# Patient Record
Sex: Female | Born: 1985 | Race: Asian | Hispanic: No | Marital: Married | Smoking: Never smoker
Health system: Southern US, Community
[De-identification: ages and names within clinical notes are randomized; demographics above are authoritative.]

## PROBLEM LIST (undated history)

## (undated) DIAGNOSIS — Z789 Other specified health status: Secondary | ICD-10-CM

## (undated) HISTORY — PX: NO PAST SURGERIES: SHX2092

---

## 2010-10-23 NOTE — L&D Delivery Note (Signed)
Delivery Note At 11:30 AM a viable and healthy female was delivered via Vaginal, Spontaneous Delivery (Presentation: Right Occiput Anterior).  APGAR: 9, 9; weight 5 lb 15.6 oz (2710 g).  Terminal meconium. Placenta status: Intact, Spontaneous.  Cord: 3 vessels with the following complications: None.  Fundus firm after uterine massage. No complications with shoulders.  Anesthesia: None  Episiotomy: None Lacerations: 1st degree vaginal Suture Repair: 3.0 vicryl Est. Blood Loss (mL): 200  Mom to postpartum.  Baby to room in with mother.   Infant delivered by Payton Doughty, PA-S Delivery supervised by Dr. Cyndia Bent, [facility] 06/06/2011, 12:10 PM

## 2011-04-24 ENCOUNTER — Other Ambulatory Visit: Payer: Self-pay | Admitting: Family Medicine

## 2011-04-24 DIAGNOSIS — Z3689 Encounter for other specified antenatal screening: Secondary | ICD-10-CM

## 2011-04-24 LAB — ABO/RH: RH Type: POSITIVE

## 2011-04-24 LAB — RPR: RPR: NONREACTIVE

## 2011-04-24 LAB — RUBELLA ANTIBODY, IGM: Rubella: IMMUNE

## 2011-04-24 LAB — ANTIBODY SCREEN: Antibody Screen: NEGATIVE

## 2011-04-24 LAB — HEPATITIS B SURFACE ANTIGEN: Hepatitis B Surface Ag: NEGATIVE

## 2011-04-25 ENCOUNTER — Ambulatory Visit (HOSPITAL_COMMUNITY)
Admission: RE | Admit: 2011-04-25 | Discharge: 2011-04-25 | Disposition: A | Discharge status: Home / Self Care | Payer: Medicaid Other | Source: Ambulatory Visit | Attending: Family Medicine | Admitting: Family Medicine

## 2011-04-25 DIAGNOSIS — Z1389 Encounter for screening for other disorder: Secondary | ICD-10-CM | POA: Insufficient documentation

## 2011-04-25 DIAGNOSIS — Z363 Encounter for antenatal screening for malformations: Secondary | ICD-10-CM | POA: Insufficient documentation

## 2011-04-25 DIAGNOSIS — Z3689 Encounter for other specified antenatal screening: Secondary | ICD-10-CM

## 2011-04-25 DIAGNOSIS — O3660X0 Maternal care for excessive fetal growth, unspecified trimester, not applicable or unspecified: Secondary | ICD-10-CM | POA: Insufficient documentation

## 2011-04-25 DIAGNOSIS — O358XX0 Maternal care for other (suspected) fetal abnormality and damage, not applicable or unspecified: Secondary | ICD-10-CM | POA: Insufficient documentation

## 2011-06-06 ENCOUNTER — Encounter (HOSPITAL_COMMUNITY): Payer: Self-pay

## 2011-06-06 ENCOUNTER — Inpatient Hospital Stay (HOSPITAL_COMMUNITY)
Admission: AD | Admit: 2011-06-06 | Discharge: 2011-06-08 | DRG: 775 | Disposition: A | Discharge status: Home / Self Care | Payer: Medicaid Other | Source: Ambulatory Visit | Attending: Obstetrics & Gynecology | Admitting: Obstetrics & Gynecology

## 2011-06-06 HISTORY — DX: Other specified health status: Z78.9

## 2011-06-06 LAB — CBC
MCH: 26.8 pg (ref 26.0–34.0)
MCHC: 33.6 g/dL (ref 30.0–36.0)
MCV: 79.8 fL (ref 78.0–100.0)
Platelets: 259 10*3/uL (ref 150–400)
RDW: 12.5 % (ref 11.5–15.5)

## 2011-06-06 MED ORDER — IBUPROFEN 600 MG PO TABS: 600.0000 mg | ORAL_TABLET | Freq: Four times a day (QID) | ORAL | Status: DC | PRN

## 2011-06-06 MED ORDER — SIMETHICONE 80 MG PO CHEW: 80.0000 mg | CHEWABLE_TABLET | ORAL | Status: DC | PRN

## 2011-06-06 MED ORDER — DIBUCAINE 1 % RE OINT: 1.0000 "application " | TOPICAL_OINTMENT | RECTAL | Status: DC | PRN

## 2011-06-06 MED ORDER — ONDANSETRON HCL 4 MG/2ML IJ SOLN: 4.0000 mg | Freq: Four times a day (QID) | INTRAMUSCULAR | Status: DC | PRN

## 2011-06-06 MED ORDER — ONDANSETRON HCL 4 MG PO TABS: 4.0000 mg | ORAL_TABLET | ORAL | Status: DC | PRN

## 2011-06-06 MED ORDER — LACTATED RINGERS IV SOLN: INTRAVENOUS | Status: DC

## 2011-06-06 MED ORDER — OXYCODONE-ACETAMINOPHEN 5-325 MG PO TABS: 2.0000 | ORAL_TABLET | ORAL | Status: DC | PRN

## 2011-06-06 MED ORDER — LIDOCAINE HCL (PF) 1 % IJ SOLN: 30.0000 mL | INTRAMUSCULAR | Status: DC | PRN

## 2011-06-06 MED ORDER — FLEET ENEMA 7-19 GM/118ML RE ENEM: 1.0000 | ENEMA | RECTAL | Status: DC | PRN

## 2011-06-06 MED ORDER — ACETAMINOPHEN 325 MG PO TABS: 650.0000 mg | ORAL_TABLET | ORAL | Status: DC | PRN

## 2011-06-06 MED ORDER — OXYTOCIN BOLUS FROM INFUSION
500.0000 mL | Freq: Once | INTRAVENOUS | Status: DC
  Filled 2011-06-06: qty 500
  Filled 2011-06-06: qty 1000

## 2011-06-06 MED ORDER — BENZOCAINE-MENTHOL 20-0.5 % EX AERO: 1.0000 "application " | INHALATION_SPRAY | CUTANEOUS | Status: DC | PRN

## 2011-06-06 MED ORDER — IBUPROFEN 600 MG PO TABS
600.0000 mg | ORAL_TABLET | Freq: Four times a day (QID) | ORAL | Status: DC | PRN
  Administered 2011-06-06: 600 mg via ORAL
  Filled 2011-06-06: qty 1

## 2011-06-06 MED ORDER — WITCH HAZEL-GLYCERIN EX PADS: 1.0000 "application " | MEDICATED_PAD | CUTANEOUS | Status: DC | PRN

## 2011-06-06 MED ORDER — CITRIC ACID-SODIUM CITRATE 334-500 MG/5ML PO SOLN: 30.0000 mL | ORAL | Status: DC | PRN

## 2011-06-06 MED ORDER — ONDANSETRON HCL 4 MG/2ML IJ SOLN: 4.0000 mg | INTRAMUSCULAR | Status: DC | PRN

## 2011-06-06 MED ORDER — ZOLPIDEM TARTRATE 5 MG PO TABS: 5.0000 mg | ORAL_TABLET | Freq: Every evening | ORAL | Status: DC | PRN

## 2011-06-06 MED ORDER — IBUPROFEN 600 MG PO TABS
600.0000 mg | ORAL_TABLET | Freq: Four times a day (QID) | ORAL | Status: DC
  Administered 2011-06-06 – 2011-06-08 (×7): 600 mg via ORAL
  Filled 2011-06-06 (×8): qty 1

## 2011-06-06 MED ORDER — LANOLIN HYDROUS EX OINT: TOPICAL_OINTMENT | CUTANEOUS | Status: DC | PRN

## 2011-06-06 MED ORDER — NALBUPHINE SYRINGE 5 MG/0.5 ML
5.0000 mg | INJECTION | INTRAMUSCULAR | Status: DC | PRN
  Filled 2011-06-06: qty 0.5

## 2011-06-06 MED ORDER — OXYTOCIN 20 UNITS IN LACTATED RINGERS INFUSION - SIMPLE: 125.0000 mL/h | INTRAVENOUS | Status: DC

## 2011-06-06 MED ORDER — LACTATED RINGERS IV SOLN: 500.0000 mL | INTRAVENOUS | Status: DC | PRN

## 2011-06-06 MED ORDER — DIPHENHYDRAMINE HCL 25 MG PO CAPS: 25.0000 mg | ORAL_CAPSULE | Freq: Four times a day (QID) | ORAL | Status: DC | PRN

## 2011-06-06 MED ORDER — TETANUS-DIPHTH-ACELL PERTUSSIS 5-2.5-18.5 LF-MCG/0.5 IM SUSP
0.5000 mL | Freq: Once | INTRAMUSCULAR | Status: AC
  Administered 2011-06-07: 0.5 mL via INTRAMUSCULAR

## 2011-06-06 MED ORDER — LACTATED RINGERS IV SOLN
INTRAVENOUS | Status: DC
  Administered 2011-06-06: 11:00:00 via INTRAVENOUS

## 2011-06-06 MED ORDER — SENNOSIDES-DOCUSATE SODIUM 8.6-50 MG PO TABS
2.0000 | ORAL_TABLET | Freq: Every day | ORAL | Status: DC
  Administered 2011-06-06: 2 via ORAL

## 2011-06-06 MED ORDER — LIDOCAINE HCL (PF) 1 % IJ SOLN
30.0000 mL | INTRAMUSCULAR | Status: DC | PRN
  Filled 2011-06-06 (×2): qty 30

## 2011-06-06 MED ORDER — OXYCODONE-ACETAMINOPHEN 5-325 MG PO TABS: 1.0000 | ORAL_TABLET | ORAL | Status: DC | PRN

## 2011-06-06 MED ORDER — OXYTOCIN BOLUS FROM INFUSION: 500.0000 mL | Freq: Once | INTRAVENOUS | Status: DC

## 2011-06-06 MED ORDER — PRENATAL PLUS 27-1 MG PO TABS
1.0000 | ORAL_TABLET | Freq: Every day | ORAL | Status: DC
  Administered 2011-06-06 – 2011-06-08 (×3): 1 via ORAL
  Filled 2011-06-06 (×3): qty 1

## 2011-06-06 NOTE — Progress Notes (Signed)
2nd preg, no problems with either.  Ctx's started at 0200, no bleeding or leaking

## 2011-06-06 NOTE — H&P (Signed)
Brandi Turner is a 25 y.o. female G2P1001 with IUP at [redacted]w[redacted]d presenting for active labor. Pt states she has been having contractions since 0200 every 2 minutes, associated with none vaginal bleeding, intact, with active.  She desires to no method. She plans on breast and bottle feeding. PNCare at [facility] since 23.5 wks  Prenatal History/Complications: LTC Language barrier  Past Medical History: Past Medical History  Diagnosis Date  . No pertinent past medical history     Past Surgical History: Past Surgical History  Procedure Date  . No past surgeries     Obstetrical History: OB History    Grav Para Term Preterm Abortions TAB SAB Ect Mult Living   2 1 1  0 0 0 0 0 0 1      Gynecological History: No Stis, o rHSV  Social History: History   Social History  . Marital Status: Married    Spouse Name: N/A    Number of Children: N/A  . Years of Education: N/A   Social History Main Topics  . Smoking status: Never Smoker   . Smokeless tobacco: Never Used  . Alcohol Use: No  . Drug Use: No  . Sexually Active:    Other Topics Concern  . None   Social History Narrative  . None    Family History: No family history on file.  Allergies: No Known Allergies  Prescriptions prior to admission  Medication Sig Dispense Refill  . prenatal vitamin w/FE, FA (PRENATAL 1 + 1) 27-1 MG TABS Take 1 tablet by mouth daily.          Review of Systems - Negative except per HPI   Blood pressure 123/60, pulse 79, temperature 97.8 F (36.6 C), temperature source Oral, resp. rate 20, height 5' (1.524 m), weight 53.343 kg (117 lb 9.6 oz). General appearance: alert and cooperative Lungs: clear to auscultation bilaterally Heart: regular rate and rhythm, S1, S2 normal, no murmur, click, rub or gallop Abdomen: soft, non-tender; bowel sounds normal; no masses,  no organomegaly and gravid, size cwd, vertex by leopolds, EFW 6 lbs by leopolds Extremities: extremities normal, atraumatic, no cyanosis or  edema cephalic Baseline: 130-140 bpm, Variability: Good {> 6 bpm), Accelerations: Reactive and Decelerations: Absent Frequency: Every 2-3 minutes Dilation: 6.5 Effacement (%): 90 Station: 0 Exam by:: Lucy Chris RNC   Prenatal labs: ABO, Rh: A/Positive/-- (07/02 0000) Antibody: Negative (07/02 0000) Rubella: Immune  RPR: Nonreactive (07/02 0000)  HBsAg: Negative (07/02 0000)  HIV: Non-reactive (07/02 0000)  GBS: Negative (08/08 0000)  1 hr Glucola 75 Genetic screening  Anatomy US   Assessment: Brandi Turner is a 25 y.o. G2P1001 with an IUP at [redacted]w[redacted]d presenting for active labor  Plan: 1. Admit to labor and delivery for expectant management   Brandi Turner 06/06/2011, 10:43 AM

## 2011-06-07 NOTE — Progress Notes (Signed)
Post Partum Day 1 Subjective: no complaints, up ad lib, voiding, tolerating PO and + flatus. Breastfeeding. No abdominal pain.  Pt was seen using the language line for vietnamese.   Objective: Blood pressure 101/63, pulse 66, temperature 97.8 F (36.6 C), temperature source Oral, resp. rate 18, height 5' (1.524 m), weight 117 lb 9.6 oz (53.343 kg), unknown if currently breastfeeding.  Physical Exam:  General: alert, cooperative and no distress Lochia: appropriate Uterine Fundus: firm, below umbilicus DVT Evaluation: No evidence of DVT seen on physical exam. Negative Homan's sign.   Basename 06/06/11 1052  HGB 13.0  HCT 38.7    Assessment/Plan: Plan for discharge tomorrow Contraception: pt declines birth control but was counseled on abstinence and importance of contraception postpartum Breastfeeding Continue postpartum management   LOS: 1 day   Marena Chancy 06/07/2011, 7:46 AM    Subjective:    Patient ID: Jaquelinne Glendening, female    DOB: [birth year], 25 y.o.   MRN: [id number]  HPI    Review of Systems     Objective:   Physical Exam        Assessment & Plan:

## 2011-06-07 NOTE — Progress Notes (Signed)
UR chart review completed.  

## 2011-06-08 MED ORDER — IBUPROFEN 600 MG PO TABS: 600.0000 mg | ORAL_TABLET | Freq: Four times a day (QID) | ORAL | Status: AC

## 2011-06-08 MED ORDER — DOCUSATE SODIUM 100 MG PO CAPS: 100.0000 mg | ORAL_CAPSULE | Freq: Two times a day (BID) | ORAL | Status: AC | PRN

## 2011-06-08 NOTE — Progress Notes (Signed)
Post Partum Day 2 Subjective: no complaints, up ad lib, voiding and tolerating PO  Objective: Blood pressure 100/63, pulse 70, temperature 98.2 F (36.8 C), temperature source Oral, resp. rate 18, height 5' (1.524 m), weight 117 lb 9.6 oz (53.343 kg), unknown if currently breastfeeding.  Physical Exam:  General: alert and no distress Lochia: appropriate Uterine Fundus: firm DVT Evaluation: No evidence of DVT seen on physical exam. Negative Homan's sign.   Basename 06/06/11 1052  HGB 13.0  HCT 38.7    Assessment/Plan: Discharge home and Breastfeeding.  Declines contraception   LOS: 2 days   Lindaann Slough. 06/08/2011, 5:57 AM

## 2011-06-08 NOTE — Discharge Summary (Signed)
Obstetric Discharge Summary Reason for Admission: onset of labor Prenatal Procedures: ultrasound Intrapartum Procedures: spontaneous vaginal delivery Postpartum Procedures: none Complications-Operative and Postpartum: 1st degree perineal laceration Hemoglobin  Date Value Range Status  06/06/2011 13.0  12.0-15.0 (g/dL) Final     HCT  Date Value Range Status  06/06/2011 38.7  36.0-46.0 (%) Final    Discharge Diagnoses: Term Pregnancy-delivered  Discharge Information: Date: 06/08/2011 Activity: pelvic rest Diet: routine Medications: Ibuprophen and Colace Condition: stable Instructions: refer to practice specific booklet Discharge to: home Follow-up Information    Follow up with [facility] [place]. Call in 6 weeks. (postpartum visit)    Contact information:   [address] [facility] [place] [address]          Newborn Data: Live born female  Birth Weight: 5 lb 15.6 oz (2710 g) APGAR: 9, 9  Home with mother.  Lindaann Slough MD 06/08/2011, 6:00 AM

## 2013-09-04 ENCOUNTER — Other Ambulatory Visit (HOSPITAL_COMMUNITY): Payer: Self-pay | Admitting: Nurse Practitioner

## 2013-09-04 DIAGNOSIS — Z3689 Encounter for other specified antenatal screening: Secondary | ICD-10-CM

## 2013-09-04 LAB — OB RESULTS CONSOLE RPR: RPR: NONREACTIVE

## 2013-09-04 LAB — OB RESULTS CONSOLE ABO/RH: RH Type: POSITIVE

## 2013-09-04 LAB — OB RESULTS CONSOLE HIV ANTIBODY (ROUTINE TESTING): HIV: NONREACTIVE

## 2013-09-04 LAB — OB RESULTS CONSOLE ANTIBODY SCREEN: ANTIBODY SCREEN: NEGATIVE

## 2013-09-04 LAB — OB RESULTS CONSOLE GC/CHLAMYDIA
Chlamydia: NEGATIVE
GC PROBE AMP, GENITAL: NEGATIVE

## 2013-09-04 LAB — OB RESULTS CONSOLE RUBELLA ANTIBODY, IGM: Rubella: IMMUNE

## 2013-09-04 LAB — OB RESULTS CONSOLE HEPATITIS B SURFACE ANTIGEN: Hepatitis B Surface Ag: NEGATIVE

## 2013-09-10 ENCOUNTER — Ambulatory Visit (HOSPITAL_COMMUNITY): Admission: RE | Admit: 2013-09-10 | Payer: Medicaid Other | Source: Ambulatory Visit

## 2013-09-10 ENCOUNTER — Other Ambulatory Visit (HOSPITAL_COMMUNITY): Payer: Self-pay | Admitting: Nurse Practitioner

## 2013-09-10 ENCOUNTER — Ambulatory Visit (HOSPITAL_COMMUNITY)
Admission: RE | Admit: 2013-09-10 | Discharge: 2013-09-10 | Disposition: A | Discharge status: Home / Self Care | Payer: Medicaid Other | Source: Ambulatory Visit | Attending: Nurse Practitioner | Admitting: Nurse Practitioner

## 2013-09-10 DIAGNOSIS — Z3689 Encounter for other specified antenatal screening: Secondary | ICD-10-CM

## 2013-10-22 ENCOUNTER — Other Ambulatory Visit (HOSPITAL_COMMUNITY): Payer: Self-pay | Admitting: Nurse Practitioner

## 2013-10-22 DIAGNOSIS — Z0374 Encounter for suspected problem with fetal growth ruled out: Secondary | ICD-10-CM

## 2013-10-23 NOTE — L&D Delivery Note (Signed)
Delivery Note At 7:23 AM a viable female was delivered via Vaginal, Spontaneous Delivery (Presentation: Left Occiput Anterior).  APGAR: 7, 9; weight 5 lb 13.1 oz (2640 g).   Placenta status: Intact, Spontaneous.  Cord: 3 vessels with the following complications: None.    Anesthesia: None  Episiotomy: None Lacerations: hemostatic 1st degree Suture Repair: na Est. Blood Loss (mL): 300  Mom to postpartum.  Baby to Couplet care / Skin to Skin.  Pt presented and quickly progressed to complete and pushed to deliver a liveborn female with spontaneous cry.  Baby on mom's abdomen. Cord clamped and cut.  Placenta delivered intact with traction and pit. 1st degree laceration hemostatic and not repaired as good cosmetic results noted with natural skin tension.  EBL as above. Mom to postpartum and baby to skin to skin.   Nikitta Sobiech L 12/09/2013, 8:52 AM

## 2013-10-28 ENCOUNTER — Ambulatory Visit (HOSPITAL_COMMUNITY)
Admission: RE | Admit: 2013-10-28 | Discharge: 2013-10-28 | Disposition: A | Discharge status: Home / Self Care | Payer: Medicaid Other | Source: Ambulatory Visit | Attending: Nurse Practitioner | Admitting: Nurse Practitioner

## 2013-10-28 DIAGNOSIS — Z0374 Encounter for suspected problem with fetal growth ruled out: Secondary | ICD-10-CM

## 2013-10-28 DIAGNOSIS — O365990 Maternal care for other known or suspected poor fetal growth, unspecified trimester, not applicable or unspecified: Secondary | ICD-10-CM | POA: Insufficient documentation

## 2013-10-28 IMAGING — US US OB FOLLOW-UP
1 series · 12 of 28 positions shown · non-contrast
Comparison: none

[Series 1: us ob follow up · 37 acquisitions, 12 frames shown]
[im 2/37]
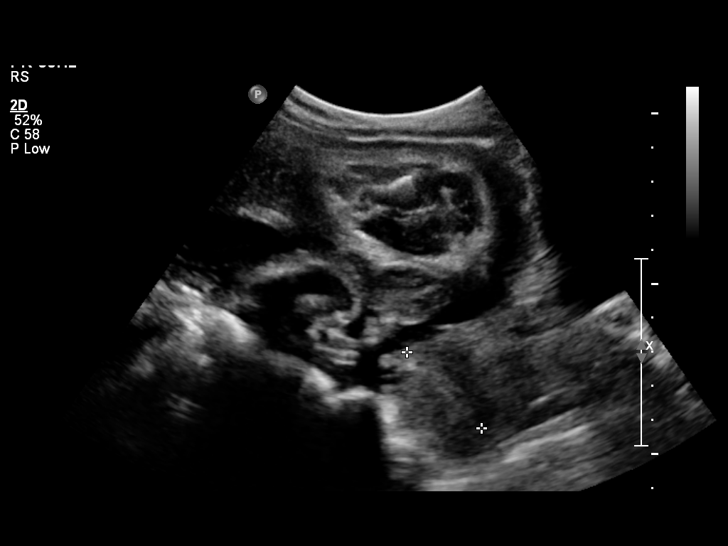
[im 5/37]
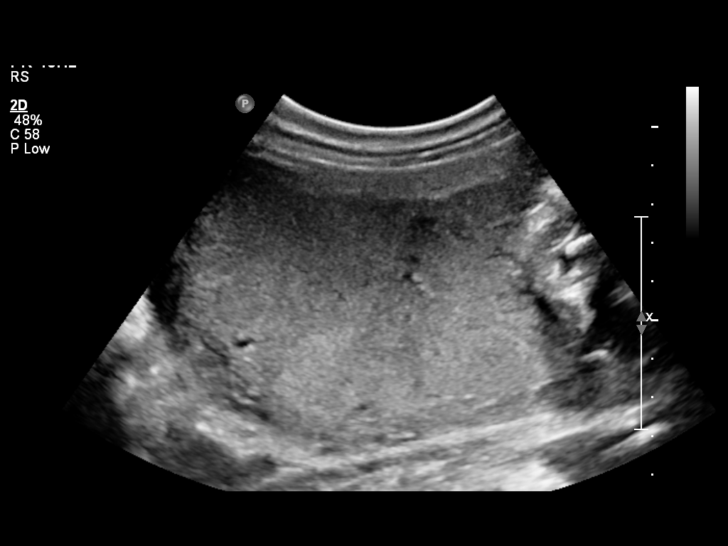
[im 7/37]
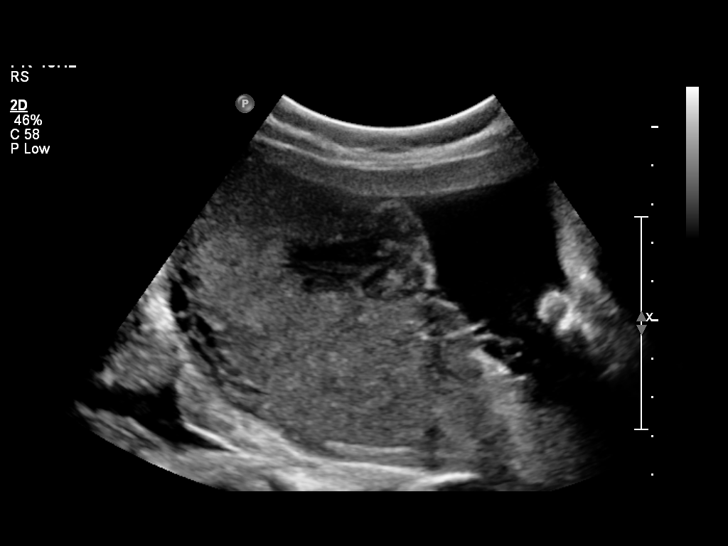
[im 11/37]
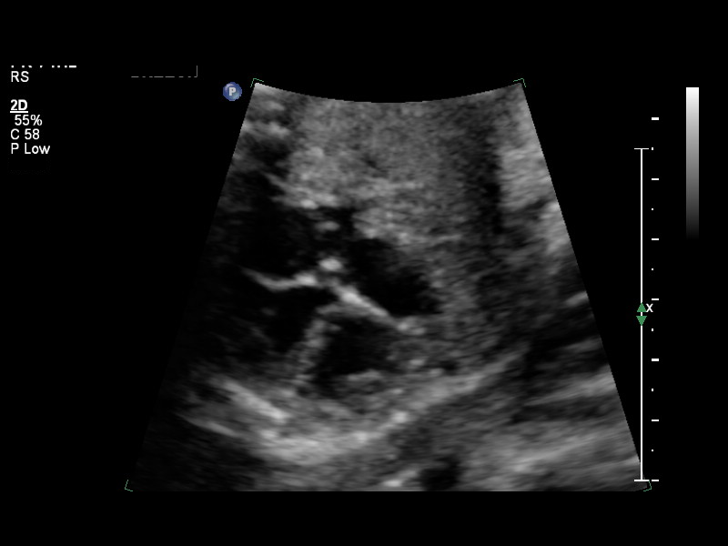
[im 14/37]
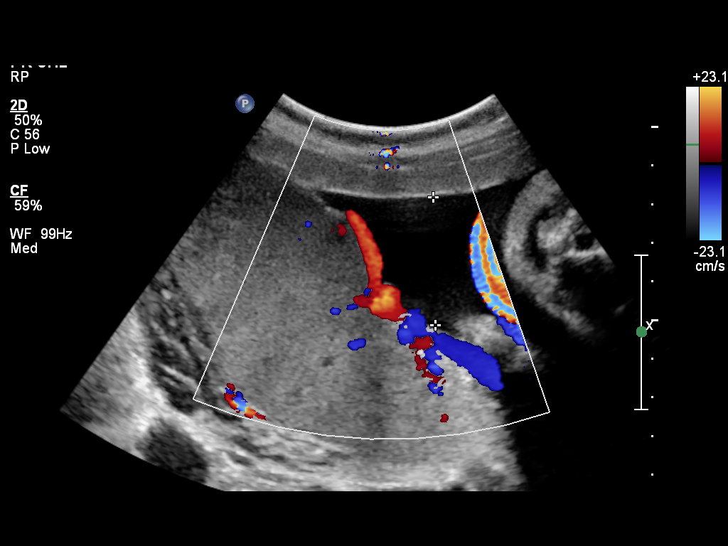
[im 17/37]
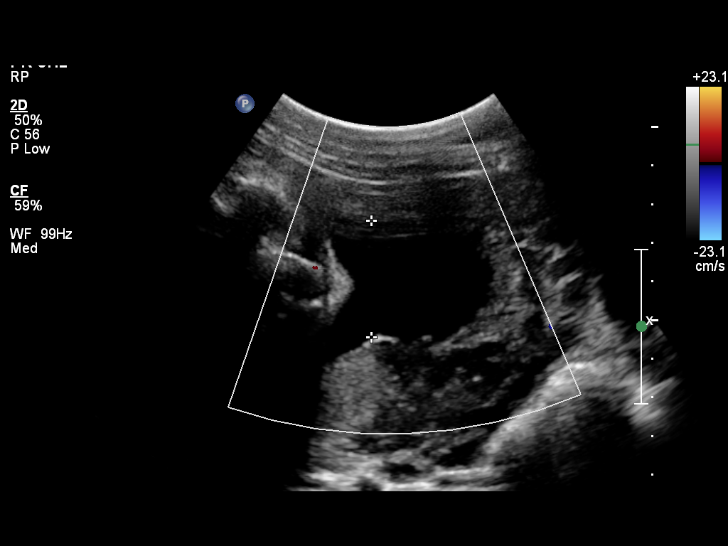
[im 21/37]
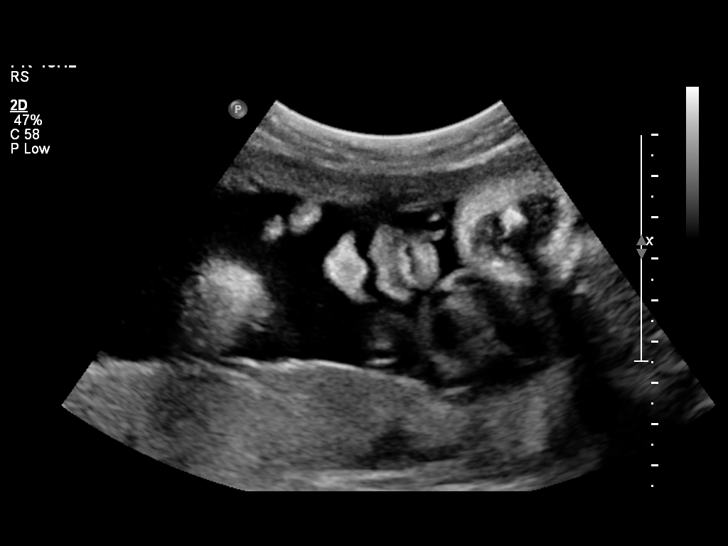
[im 23/37]
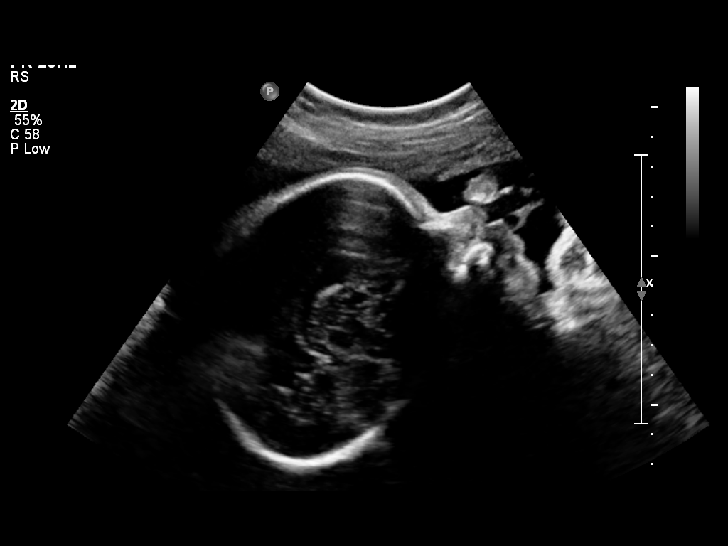
[im 26/37]
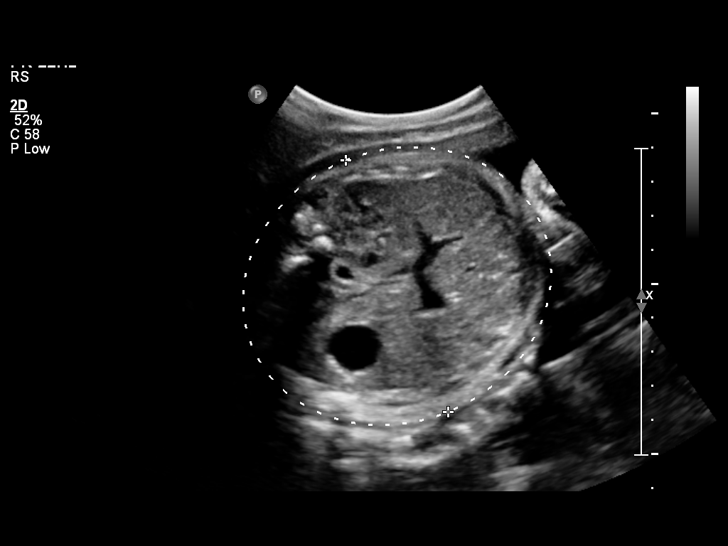
[im 30/37]
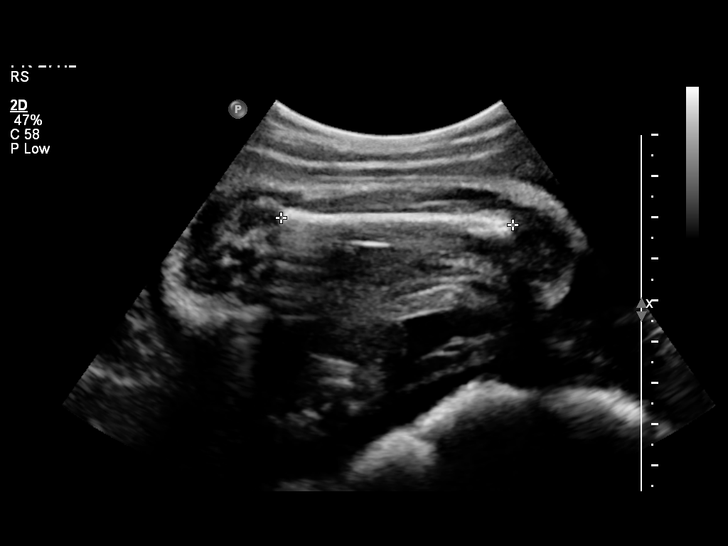
[im 33/37]
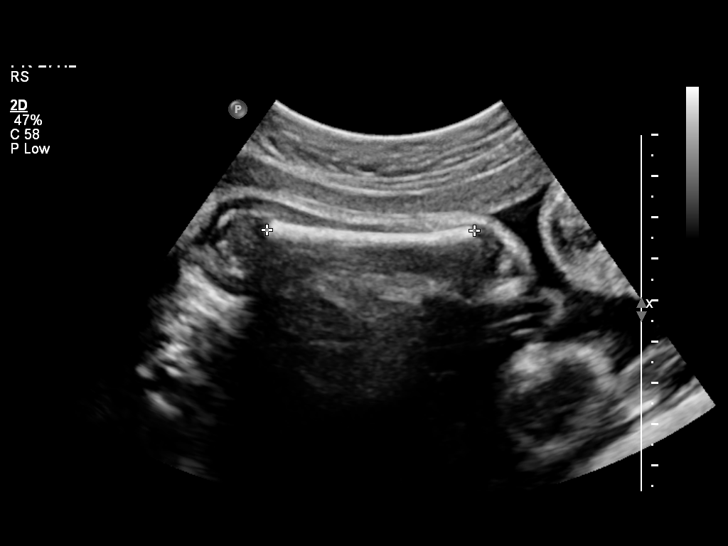
[im 35/37]
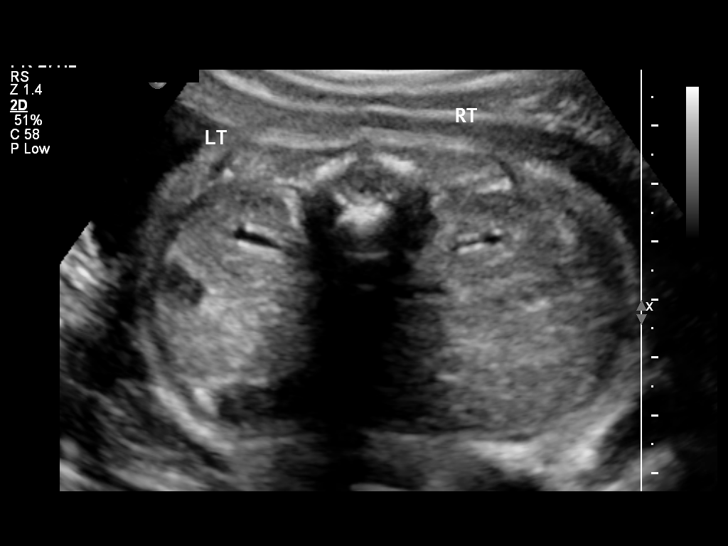

[12 of 28 positions shown; findings below may reference images not displayed]

OBSTETRICS REPORT
                      (Signed Final [DATE] [DATE])

Service(s) Provided

 US OB FOLLOW UP                                       76816.1
Indications

 Size less than dates (Small for gestational [AGE]
 FGR)
Fetal Evaluation

 Num Of Fetuses:    1
 Fetal Heart Rate:  132                          bpm
 Cardiac Activity:  Observed
 Presentation:      Breech
 Placenta:          Posterior Fundal, above
                    cervical os
 P. Cord            Previously Visualized
 Insertion:

 Amniotic Fluid
 AFI FV:      Subjectively within normal limits
 AFI Sum:     10.08   cm       16  %Tile     Larg Pckt:    3.29  cm
 RUQ:   3.29    cm   RLQ:    2.67   cm    LUQ:   3       cm   LLQ:    1.12   cm
Biometry

 BPD:     75.4  mm     G. Age:  30w 2d                CI:        67.02   70 - 86
                                                      FL/HC:      19.0   19.3 -

 HC:       295  mm     G. Age:  32w 4d       49  %    HC/AC:      1.10   0.96 -

 AC:       269  mm     G. Age:  31w 0d       39  %    FL/BPD:     74.3   71 - 87
 FL:        56  mm     G. Age:  29w 4d        5  %    FL/AC:      20.8   20 - 24
 HUM:     50.3  mm     G. Age:  29w 3d       13  %

 Est. FW:    [0X]  gm      3 lb 9 oz     42  %
Gestational Age

 U/S Today:     30w 6d                                        EDD:   [DATE]
 Best:          31w 2d     Det. By:  U/S ([DATE])           EDD:   [DATE]
Anatomy

 Cranium:          Appears normal         Aortic Arch:      Previously seen
 Fetal Cavum:      Previously seen        Ductal Arch:      Previously seen
 Ventricles:       Appears normal         Diaphragm:        Previously seen
 Choroid Plexus:   Previously seen        Stomach:          Appears normal, left
                                                            sided
 Cerebellum:       Previously seen        Abdomen:          Previously seen
 Posterior Fossa:  Previously seen        Abdominal Wall:   Previously seen
 Nuchal Fold:      Previously seen        Cord Vessels:     Previously seen
 Face:             Orbits and profile     Kidneys:          Appear normal
                   previously seen
 Lips:             Previously seen        Bladder:          Appears normal
 Heart:            Appears normal         Spine:            Previously seen
                   (4CH, axis, and
                   situs)
 RVOT:             Previously seen        Lower             Previously seen
                                          Extremities:
 LVOT:             Previously seen        Upper             Previously seen
                                          Extremities:

 Other:  Fetus appears to be a male. Heels and Left 5th digit previously
         visualized.
Cervix Uterus Adnexa

 Cervical Length:    3.1      cm

 Cervix:       Normal appearance by transabdominal scan.
 Uterus:       No abnormality visualized.
 Left Ovary:    No adnexal mass visualized.
 Right Ovary:   No adnexal mass visualized.

 Adnexa:     No abnormality visualized.
Impression

 Single IUP at 31 [DATE] weeks
 Fetal growth is appropriate (42nd %tile)
 Posterior fundal placenta without previa
 Normal amniotic fluid volume
Recommendations

 Follow-up ultrasounds as clinically indicated.

 questions or concerns.

## 2013-11-17 ENCOUNTER — Other Ambulatory Visit (HOSPITAL_COMMUNITY): Payer: Self-pay | Admitting: Nurse Practitioner

## 2013-11-17 DIAGNOSIS — O329XX0 Maternal care for malpresentation of fetus, unspecified, not applicable or unspecified: Secondary | ICD-10-CM

## 2013-11-20 ENCOUNTER — Ambulatory Visit (HOSPITAL_COMMUNITY)
Admission: RE | Admit: 2013-11-20 | Discharge: 2013-11-20 | Disposition: A | Discharge status: Home / Self Care | Payer: Medicaid Other | Source: Ambulatory Visit | Attending: Nurse Practitioner | Admitting: Nurse Practitioner

## 2013-11-20 DIAGNOSIS — O329XX0 Maternal care for malpresentation of fetus, unspecified, not applicable or unspecified: Secondary | ICD-10-CM

## 2013-11-20 DIAGNOSIS — O365990 Maternal care for other known or suspected poor fetal growth, unspecified trimester, not applicable or unspecified: Secondary | ICD-10-CM | POA: Insufficient documentation

## 2013-11-28 LAB — OB RESULTS CONSOLE GBS: STREP GROUP B AG: NEGATIVE

## 2013-12-09 ENCOUNTER — Encounter (HOSPITAL_COMMUNITY): Payer: Self-pay | Admitting: *Deleted

## 2013-12-09 ENCOUNTER — Inpatient Hospital Stay (HOSPITAL_COMMUNITY)
Admission: AD | Admit: 2013-12-09 | Discharge: 2013-12-10 | DRG: 775 | Disposition: A | Discharge status: Home / Self Care | Payer: Medicaid Other | Source: Ambulatory Visit | Attending: Obstetrics & Gynecology | Admitting: Obstetrics & Gynecology

## 2013-12-09 DIAGNOSIS — IMO0001 Reserved for inherently not codable concepts without codable children: Secondary | ICD-10-CM

## 2013-12-09 DIAGNOSIS — O479 False labor, unspecified: Secondary | ICD-10-CM

## 2013-12-09 HISTORY — DX: Other specified health status: Z78.9

## 2013-12-09 LAB — CBC
HCT: 33.6 % — ABNORMAL LOW (ref 36.0–46.0)
HEMOGLOBIN: 11.3 g/dL — AB (ref 12.0–15.0)
MCH: 26.3 pg (ref 26.0–34.0)
MCHC: 33.6 g/dL (ref 30.0–36.0)
MCV: 78.1 fL (ref 78.0–100.0)
Platelets: 270 10*3/uL (ref 150–400)
RBC: 4.3 MIL/uL (ref 3.87–5.11)
RDW: 12.6 % (ref 11.5–15.5)
WBC: 9.6 10*3/uL (ref 4.0–10.5)

## 2013-12-09 LAB — RPR: RPR: NONREACTIVE

## 2013-12-09 MED ORDER — LIDOCAINE HCL (PF) 1 % IJ SOLN
30.0000 mL | INTRAMUSCULAR | Status: DC | PRN
  Filled 2013-12-09: qty 30

## 2013-12-09 MED ORDER — OXYCODONE-ACETAMINOPHEN 5-325 MG PO TABS: 1.0000 | ORAL_TABLET | ORAL | Status: DC | PRN

## 2013-12-09 MED ORDER — SIMETHICONE 80 MG PO CHEW: 80.0000 mg | CHEWABLE_TABLET | ORAL | Status: DC | PRN

## 2013-12-09 MED ORDER — ACETAMINOPHEN 325 MG PO TABS: 650.0000 mg | ORAL_TABLET | ORAL | Status: DC | PRN

## 2013-12-09 MED ORDER — IBUPROFEN 600 MG PO TABS
600.0000 mg | ORAL_TABLET | Freq: Four times a day (QID) | ORAL | Status: DC
  Administered 2013-12-09 – 2013-12-10 (×6): 600 mg via ORAL
  Filled 2013-12-09 (×6): qty 1

## 2013-12-09 MED ORDER — OXYTOCIN BOLUS FROM INFUSION
500.0000 mL | INTRAVENOUS | Status: DC
Start: 2013-12-09 — End: 2013-12-09

## 2013-12-09 MED ORDER — WITCH HAZEL-GLYCERIN EX PADS: 1.0000 "application " | MEDICATED_PAD | CUTANEOUS | Status: DC | PRN

## 2013-12-09 MED ORDER — ZOLPIDEM TARTRATE 5 MG PO TABS: 5.0000 mg | ORAL_TABLET | Freq: Every evening | ORAL | Status: DC | PRN

## 2013-12-09 MED ORDER — LACTATED RINGERS IV SOLN: 500.0000 mL | INTRAVENOUS | Status: DC | PRN

## 2013-12-09 MED ORDER — FLEET ENEMA 7-19 GM/118ML RE ENEM: 1.0000 | ENEMA | Freq: Every day | RECTAL | Status: DC | PRN

## 2013-12-09 MED ORDER — LANOLIN HYDROUS EX OINT: TOPICAL_OINTMENT | CUTANEOUS | Status: DC | PRN

## 2013-12-09 MED ORDER — ONDANSETRON HCL 4 MG/2ML IJ SOLN
4.0000 mg | Freq: Four times a day (QID) | INTRAMUSCULAR | Status: DC | PRN
Start: 2013-12-09 — End: 2013-12-09

## 2013-12-09 MED ORDER — DIBUCAINE 1 % RE OINT
1.0000 "application " | TOPICAL_OINTMENT | RECTAL | Status: DC | PRN
  Filled 2013-12-09: qty 28

## 2013-12-09 MED ORDER — DIPHENHYDRAMINE HCL 25 MG PO CAPS
25.0000 mg | ORAL_CAPSULE | Freq: Four times a day (QID) | ORAL | Status: DC | PRN
Start: 2013-12-09 — End: 2013-12-10

## 2013-12-09 MED ORDER — SENNOSIDES-DOCUSATE SODIUM 8.6-50 MG PO TABS
2.0000 | ORAL_TABLET | ORAL | Status: DC
  Administered 2013-12-10: 2 via ORAL
  Filled 2013-12-09: qty 2

## 2013-12-09 MED ORDER — ONDANSETRON HCL 4 MG/2ML IJ SOLN: 4.0000 mg | INTRAMUSCULAR | Status: DC | PRN

## 2013-12-09 MED ORDER — PRENATAL MULTIVITAMIN CH
1.0000 | ORAL_TABLET | Freq: Every day | ORAL | Status: DC
  Administered 2013-12-09 – 2013-12-10 (×2): 1 via ORAL
  Filled 2013-12-09 (×2): qty 1

## 2013-12-09 MED ORDER — CITRIC ACID-SODIUM CITRATE 334-500 MG/5ML PO SOLN: 30.0000 mL | ORAL | Status: DC | PRN

## 2013-12-09 MED ORDER — OXYTOCIN 40 UNITS IN LACTATED RINGERS INFUSION - SIMPLE MED
62.5000 mL/h | INTRAVENOUS | Status: DC
  Administered 2013-12-09: 999 mL/h via INTRAVENOUS
  Filled 2013-12-09: qty 1000

## 2013-12-09 MED ORDER — IBUPROFEN 600 MG PO TABS: 600.0000 mg | ORAL_TABLET | Freq: Four times a day (QID) | ORAL | Status: DC | PRN

## 2013-12-09 MED ORDER — ONDANSETRON HCL 4 MG PO TABS: 4.0000 mg | ORAL_TABLET | ORAL | Status: DC | PRN

## 2013-12-09 MED ORDER — LACTATED RINGERS IV SOLN: INTRAVENOUS | Status: DC

## 2013-12-09 MED ORDER — TETANUS-DIPHTH-ACELL PERTUSSIS 5-2.5-18.5 LF-MCG/0.5 IM SUSP: 0.5000 mL | Freq: Once | INTRAMUSCULAR | Status: DC

## 2013-12-09 MED ORDER — BENZOCAINE-MENTHOL 20-0.5 % EX AERO
1.0000 "application " | INHALATION_SPRAY | CUTANEOUS | Status: DC | PRN
  Filled 2013-12-09: qty 56

## 2013-12-09 NOTE — H&P (Signed)
Brandi Turner is a 28 y.o. female presenting for active labor. She states her contractions began around 3 am and have worsened in frequency and intensity since then. She denies LOF, decreased fetal movement, vaginal bleeding, and vaginal discharge. She also denies headache, vision change, and edema as well.     History OB History   Grav Para Term Preterm Abortions TAB SAB Ect Mult Living   3 2 2  0 0 0 0 0 0 2     Past Medical History  Diagnosis Date  . No pertinent past medical history   . Medical history non-contributory    Past Surgical History  Procedure Laterality Date  . No past surgeries     Family History: family history is not on file. Social History:  reports that she has never smoked. She has never used smokeless tobacco. She reports that she does not drink alcohol or use illicit drugs.   Prenatal Transfer Tool  Maternal Diabetes: No Genetic Screening: Declined Maternal Ultrasounds/Referrals: Normal Fetal Ultrasounds or other Referrals:  None Maternal Substance Abuse:  No Significant Maternal Medications:  None Significant Maternal Lab Results:  Lab values include: Group B Strep negative Other Comments:  None  ROS  Dilation: 5.5 Effacement (%): 100 Station: -2 Exam by:: Elie ConferK. Weiss RN Blood pressure 117/76, temperature 98.2 F (36.8 C), temperature source Oral, resp. rate 16, height 5\' 1"  (1.549 m), weight 55.339 kg (122 lb), last menstrual period 03/18/2013. Exam Physical Exam   Gen: NAD, alert, cooperative with exam HEENT: NCAT CV: RRR, good S1/S2, no murmur Resp: CTABL, no wheezes, non-labored Abd: SNT, gravid Ext: No edema, warm Neuro: Alert and oriented, No gross deficits  FHT: Baseline 135, Moderate variability, no accels, no decels Toco: regular q 2-3 minutes  Prenatal labs: ABO, Rh: A/Positive/-- (11/13 0000) Antibody: Negative (11/13 0000) Rubella: Immune (11/13 0000) RPR: Nonreactive (11/13 0000)  HBsAg: Negative (11/13 0000)  HIV: Non-reactive  (11/13 0000)  GBS: Negative (02/06 0000)   Assessment/Plan: 28 y/o G3P3003 @ 38.0 here with active labor  Labor: Active labor, manage expectantly Pain: No pain meds desired, Fentanyl PRN if wanted Fetal well being: Category 2 tracing ID: GBS negative   Kevin FentonBradshaw, Samuel 12/09/2013, 6:58 AM   I have seen and examined this patient and agree with above documentation in the residents note.  EFW 5.5lbs  Rulon AbideKeli Laconda Basich, M.D. [facility] 12/09/2013 9:08 AM

## 2013-12-09 NOTE — Progress Notes (Signed)
UR completed 

## 2013-12-10 LAB — CBC
HEMATOCRIT: 31.4 % — AB (ref 36.0–46.0)
Hemoglobin: 10.4 g/dL — ABNORMAL LOW (ref 12.0–15.0)
MCH: 25.9 pg — ABNORMAL LOW (ref 26.0–34.0)
MCHC: 33.1 g/dL (ref 30.0–36.0)
MCV: 78.3 fL (ref 78.0–100.0)
Platelets: 283 10*3/uL (ref 150–400)
RBC: 4.01 MIL/uL (ref 3.87–5.11)
RDW: 12.9 % (ref 11.5–15.5)
WBC: 8.9 10*3/uL (ref 4.0–10.5)

## 2013-12-10 MED ORDER — IBUPROFEN 600 MG PO TABS: 600.0000 mg | ORAL_TABLET | Freq: Four times a day (QID) | ORAL | Status: DC

## 2013-12-10 NOTE — Progress Notes (Signed)
I spoke with and examined patient and agree with resident's note and plan of care.  Brandi ScaleMichael Turner Brandi Marsan, MD OB Fellow 12/10/2013 9:38 AM

## 2013-12-10 NOTE — Discharge Summary (Signed)
Obstetric Discharge Summary Reason for Admission: onset of labor Prenatal Procedures: none Intrapartum Procedures: spontaneous vaginal delivery Postpartum Procedures: none Complications-Operative and Postpartum: 1st degree perineal laceration  Hospital Course:  Delivery Note  At 7:23 AM a viable female was delivered via Vaginal, Spontaneous Delivery (Presentation: Left Occiput Anterior). APGAR: 7, 9; weight 5 lb 13.1 oz (2640 g).  Placenta status: Intact, Spontaneous. Cord: 3 vessels with the following complications: None.  Anesthesia: None  Episiotomy: None  Lacerations: hemostatic 1st degree  Suture Repair: na  Est. Blood Loss (mL): 300  Mom to postpartum. Baby to Couplet care / Skin to Skin.  Pt presented and quickly progressed to complete and pushed to deliver a liveborn female with spontaneous cry. Baby on mom's abdomen. Cord clamped and cut. Placenta delivered intact with traction and pit. 1st degree laceration hemostatic and not repaired as good cosmetic results noted with natural skin tension. EBL as above. Mom to postpartum and baby to skin to skin.   H/H: Lab Results  Component Value Date/Time   HGB 10.4* 12/10/2013  6:04 AM   HCT 31.4* 12/10/2013  6:04 AM   Discharge Diagnoses: Term Pregnancy-delivered  Discharge Information: Date: 05/04/2011 Activity: pelvic rest Diet: routine  Medications: Ibuprofen Breast feeding:  Yes Condition: stable Instructions: refer to handout Discharge to: home   Discharge Orders   Future Orders Complete By Expires   Increase activity slowly  As directed        Medication List         ibuprofen 600 MG tablet  Commonly known as:  ADVIL,MOTRIN  Take 1 tablet (600 mg total) by mouth every 6 (six) hours.     prenatal vitamin w/FE, FA 27-1 MG Tabs tablet  Take 1 tablet by mouth daily.           Follow-up Information   [facility] [place] Call today. (for appt in 4 weeks)    Contact information:   [address] [place] [id number] [phone]      Wenda LowJoyner, James 12/10/2013,9:31 AM  I spoke with and examined patient and agree with resident's note and plan of care.  Tawana ScaleMichael Ryan Xzaviar Maloof, MD OB Fellow 12/10/2013 9:38 AM

## 2013-12-10 NOTE — Lactation Note (Signed)
This note was copied from the chart of Brandi Teila Ridener. Lactation Consultation Note  Patient Name: Brandi Levonne HubertLoc Icard ZOXWR'UToday's Date: 12/10/2013 Reason for consult: Follow-up assessment;Other (Comment) (limited English - [place] [phone]- #[id number] ) Discussed 38 week <6 pounds potential feeding behavior . Praised mom and dad for how well the baby was latching. LC was able to see latch with depth , mom denies sore nipples. Reviewed sore nipple and engorgement prevention and tx .  Instructed mom on the use hand pump and demonstrated it. Mom and dad aware they [facility] with questions  Or concerns with breast feeding. This is moms 3rd baby and she is an experienced breast feeder.    Maternal Data Formula Feeding for Exclusion: Yes Reason for exclusion: Mother's choice to formula and breast feed on admission  Feeding Feeding Type:  (baby already latched, multiply swallows )  LATCH Score/Interventions Latch:  (latched with depth )  Audible Swallowing:  (multiply swallows, increased with breast compressions )     Comfort (Breast/Nipple):  (per mom, per interpreter , comfortable )     Hold (Positioning):  (independent latch ) Intervention(s): Breastfeeding basics reviewed     Lactation Tools Discussed/Used Tools: Pump Breast pump type: Manual Pump Review: Setup, frequency, and cleaning Initiated by:: MAI  Date initiated:: 12/10/13   Consult Status Consult Status: Complete    Kathrin Greathouseorio, Brandi Turner Ann 12/10/2013, 1:04 PM

## 2013-12-10 NOTE — H&P (Signed)
Attestation of Attending Supervision of Fellow: Evaluation and management procedures were performed by the Fellow under my supervision and collaboration.  I have reviewed the Fellow's note and chart, and I agree with the management and plan.    

## 2013-12-10 NOTE — Discharge Instructions (Signed)
Sinh N? qua m ??o, Ch?m [place] Sau kh Sinh (Vaginal Delivery, Care After) Tham kh?o t? thng tin ny trong vi tu?n t?i. Nh?ng h??ng d?n xu?t vi?n ny cung c?p cho b?n thng tin v? ch?m [place] b?n thn sau khi sinh. Chuyn gia ch?m [place] s?c kh?e c?ng c th? cung c?p cho b?n cc h??ng d?n c? th?. ?i?u tr? c?a b?n ? ???c ln k? ho?ch theo th?c hnh y khoa m?i nh?t s?n c, nh?ng v?n ?? ?i khi v?n x?y ra. Hy g?i cho chuyn gia ch?m [place] s?c kh?e c?a mnh n?u b?n c b?t k? v?n ?? ho?c cu h?i no sau khi v? nh. H??NG D?N CH?M [place] T?I NH  Ch? s? d?ng thu?c khng c?n k toa ho?c thu?c c?n k toa theo ch? d?n c?a chuyn gia ch?m [place] s?c kh?e ho?c d??c s?.  Khng u?ng r??u ??c bi?t khi b?n ?ang nui con b ho?c u?ng thu?c ?? gi?m ?au.  Khng nhai thu?c l ho?c ht thu?c.  Khng s? d?ng ma ty b?t h?p php.  Ti?p t?c ch?m [place] t?t vng ?y ch?u. Ch?m [place] t?t vng ?y ch?u bao g?m:  Lau ?y ch?u c?a b?n t? tr??c ra sau.  Gi? ?y ch?u c?a b?n s?ch s?Imagene Sheller.  Khng s? d?ng b?ng v? sinh ho?c th?t r?a m ??o cho ??n khi chuyn gia ch?m [place] s?c kh?e cho php.  T?m, g?i ??u v t?m b?n theo ch? d?n c?a chuyn gia ch?m [place] s?c kh?e.  M?c o ng?c v?a v?n h? tr? ng?c.  ?n th?c ph?m c l?i cho s?c kh?e.  U?ng ?? n??c ?? gi? cho n??c ti?u trong ho?c vng nh?t.  ?n cc lo?i th?c ph?m nhi?u ch?t x? nh? ng? c?c nguyn h?t, bnh m, g?o l?c, cc lo?i ??u, tri cy t??i v rau qu? m?i ngy. Nh?ng th?c ph?m ny c th? gip ng?n ng?a ho?c lm gi?m to bn.  Lm hi?n theo khuy?n ngh? c?a chuyn gia ch?m [place] s?c kh?e v? vi?c b?t ??u l?i cc ho?t ??ng nh? leo c?u thang, li xe, nng, t?p th? d?c ho?c ?i l?i.  Ni chuy?n v?i chuyn gia ch?m [place] s?c kh?e v? vi?c b?t ??u l?i ho?t ??ng tnh d?c. Vi?c b?t ??u l?i ho?t ??ng tnh d?c ty thu?c vo nguy c? nhi?m trng, m?c ?? lnh b?nh, c?m gic tho?i mi v mong mu?n ti?p t?c ho?t ??ng tnh d?c c?a b?n.  C? g?ng ?? c ai ? gip b?n cc cng vi?c n?i tr? v tr? m?i sinh cho t  nh?t m?t vi ngy sau khi xu?t vi?n.  Ngh? ng?i cng nhi?u cng t?t. C? g?ng ngh? ng?i ho?c c gi?c ng? ng?n khi tr? m?i sinh ?ang ng?Marland Kitchen.  T?ng ho?t ??ng d?n d?n.  Tun th? m?i cu?c h?n lin quan ngay sau khi sinh ? ???c s?p x?p l?ch. ?i?u r?t quan tr?ng l ph?i gi? m?i cu?c h?n khm l?i ? ???c s?p x?p l?ch. T?i cc cu?c h?n ny, chuyn gia ch?m  s?c kh?e s? ki?m tra ?? ??m b?o r?ng b?n ?ang h?i ph?c v? m?t th? ch?t v tnh c?m. HY ?I KHM N?U:  m ??o cho ra cc c?c mu ?ng l?n. Gi? l?i m?i c?c mu ?ng ?? chuyn gia ch?m  s?c kh?e xem.  D?ch m ??o c mi hi.  B?n g?p v?n ?? khi ti?u ti?n.  B?n ?i ti?u th??ng xuyn.  B?n b? ?au khi ?i ti?u.  C s? thay ??i trong ??  i ti?n.  B? t?y ??, ?au ho?c s?ng g?n v?t r?ch m ??o (c?t t?ng sinh mn) hay v?t rch m ??o.  C m? ch?y ra t? v?t c?t t?ng sinh mn ho?c v?t rch m ??o.  V?t c?t t?ng sinh mn ho?c v?t rch m ??o h mi?ng.  Ng?c b? ?au, c?ng ho?c ??.  B?n b? ?au ??u r?t nhi?u.  M?t b?n b? m? ho?c nhn th?y cc v?t l?m ??m.  B?n c?m th?y bu?n ho?c chn n?n.  B?n c suy ngh? v? vi?c t? lm t?n th??ng mnh ho?c tr? m?i sinh.  B?n c cu h?i v? vi?c ch?m [place] b?n thn, ch?m  tr? m?i sinh ho?c v? thu?c.  B?n b? chng m?t ho?c ?au ??u.  B?n b? pht ban.  B?n b? bu?n nn ho?c nn m?a.  B?n ? cho con b v ch?a c kinh nguy?t trong vng 12 tu?n sau khi ng?ng cho con b.  B?n khng cho con b v ch?a c kinh nguy?t trong kho?ng 12 tu?n sau khi sinh.  B?n b? s?t. HY NGAY L?P T?C ?I KHM N?U:  B?n b? ?au dai d?ng.  B?n b? ?au ng?c.  B?n b? kh th?.  B?n b? ng?t.  B?n b? ?au chn.  B?n b? ?au d? dy.  Ch?y mu m ??o th?m ??m hai mi?ng dn v? sinh tr? ln trong 1 ti?ng. ??M B?O B?N:  Hi?u cc h??ng d?n ny.  S? theo di tnh tr?ng c?a mnh.  S? yu c?u tr? gip ngay l?p t?c n?u b?n c?m th?y khng ?? ho?c tnh tr?ng tr?m tr?ng h?n. Document Released: 10/09/2005 Document Revised:  06/11/2013 [facility] Patient Information 2014 [place], [place].

## 2013-12-10 NOTE — Progress Notes (Signed)
Post Partum Day 1 Subjective: no complaints, up ad lib, voiding, tolerating PO, + flatus and mild appropriate vaginal bleeding  Objective: Blood pressure 101/68, pulse 61, temperature 97.3 F (36.3 C), temperature source Oral, resp. rate 20, height 5\' 1"  (1.549 m), weight 55.339 kg (122 lb), last menstrual period 03/18/2013, SpO2 97.00%, unknown if currently breastfeeding.  Physical Exam:  General: alert, cooperative and no distress Lochia: appropriate Uterine Fundus: firm Incision: na DVT Evaluation: No evidence of DVT seen on physical exam. Negative Homan's sign. No cords or calf tenderness. No significant calf/ankle edema.   Recent Labs  12/09/13 0700  HGB 11.3*  HCT 33.6*    Assessment/Plan: - She is breastfeeding w/o difficulty - Contraception and circumcision both discussed and declined - Will Call to set-up PCP follow-up for Friday - Needs help completing birth certificate   Possible discharge today pending above items   LOS: 1 day   Wenda LowJoyner, Ashrith Sagan 12/10/2013, 7:10 AM

## 2014-08-24 ENCOUNTER — Encounter (HOSPITAL_COMMUNITY): Payer: Self-pay | Admitting: *Deleted

## 2015-10-24 NOTE — L&D Delivery Note (Signed)
Delivery Note At  a viable unspecified sex was delivered via  (Presentation: LOA ; Compound with hand ).  APGAR: 9 and 9 , weight  pending.   Placenta status: placenta intact delivered with gently traction .  Cord: 3 vessels with the following complications: wrapped around hand.   Anesthesia:  none Episiotomy: none Lacerations:  none Est. Blood Loss (mL):  100  Mom to postpartum.  Baby to Couplet care / Skin to Skin.  Ernestina Pennaicholas TRUE Garciamartinez 08/20/2016, 2:19 AM

## 2016-08-07 LAB — OB RESULTS CONSOLE GBS: STREP GROUP B AG: POSITIVE

## 2016-08-18 ENCOUNTER — Inpatient Hospital Stay (HOSPITAL_COMMUNITY)
Admission: AD | Admit: 2016-08-18 | Discharge: 2016-08-18 | Disposition: A | Discharge status: Home / Self Care | Payer: Medicaid Other | Source: Ambulatory Visit | Attending: Obstetrics and Gynecology | Admitting: Obstetrics and Gynecology

## 2016-08-18 ENCOUNTER — Encounter (HOSPITAL_COMMUNITY): Payer: Self-pay | Admitting: *Deleted

## 2016-08-18 DIAGNOSIS — Z3A38 38 weeks gestation of pregnancy: Secondary | ICD-10-CM | POA: Diagnosis not present

## 2016-08-18 NOTE — MAU Note (Signed)
Contractions since 1200. Denies LOF or bleeding.

## 2016-08-20 ENCOUNTER — Inpatient Hospital Stay (HOSPITAL_COMMUNITY)
Admission: AD | Admit: 2016-08-20 | Discharge: 2016-08-22 | DRG: 775 | Disposition: A | Discharge status: Home / Self Care | Payer: Medicaid Other | Source: Ambulatory Visit | Attending: Obstetrics & Gynecology | Admitting: Obstetrics & Gynecology

## 2016-08-20 ENCOUNTER — Encounter (HOSPITAL_COMMUNITY): Payer: Self-pay | Admitting: *Deleted

## 2016-08-20 DIAGNOSIS — O99824 Streptococcus B carrier state complicating childbirth: Secondary | ICD-10-CM | POA: Diagnosis present

## 2016-08-20 DIAGNOSIS — Z3483 Encounter for supervision of other normal pregnancy, third trimester: Secondary | ICD-10-CM | POA: Diagnosis present

## 2016-08-20 DIAGNOSIS — Z3A38 38 weeks gestation of pregnancy: Secondary | ICD-10-CM

## 2016-08-20 LAB — CBC
HEMATOCRIT: 35 % — AB (ref 36.0–46.0)
HEMOGLOBIN: 12 g/dL (ref 12.0–15.0)
MCH: 26.6 pg (ref 26.0–34.0)
MCHC: 34.3 g/dL (ref 30.0–36.0)
MCV: 77.6 fL — ABNORMAL LOW (ref 78.0–100.0)
Platelets: 219 10*3/uL (ref 150–400)
RBC: 4.51 MIL/uL (ref 3.87–5.11)
RDW: 12.8 % (ref 11.5–15.5)
WBC: 8.5 10*3/uL (ref 4.0–10.5)

## 2016-08-20 LAB — TYPE AND SCREEN
ABO/RH(D): A POS
Antibody Screen: NEGATIVE

## 2016-08-20 LAB — ABO/RH: ABO/RH(D): A POS

## 2016-08-20 LAB — RPR: RPR Ser Ql: NONREACTIVE

## 2016-08-20 MED ORDER — SODIUM CHLORIDE 0.9 % IV SOLN
2.0000 g | Freq: Once | INTRAVENOUS | Status: AC
  Administered 2016-08-20: 2 g via INTRAVENOUS
  Filled 2016-08-20: qty 2000

## 2016-08-20 MED ORDER — SIMETHICONE 80 MG PO CHEW: 80.0000 mg | CHEWABLE_TABLET | ORAL | Status: DC | PRN

## 2016-08-20 MED ORDER — OXYCODONE-ACETAMINOPHEN 5-325 MG PO TABS: 1.0000 | ORAL_TABLET | ORAL | Status: DC | PRN

## 2016-08-20 MED ORDER — ONDANSETRON HCL 4 MG PO TABS: 4.0000 mg | ORAL_TABLET | ORAL | Status: DC | PRN

## 2016-08-20 MED ORDER — LACTATED RINGERS IV SOLN: 500.0000 mL | INTRAVENOUS | Status: DC | PRN

## 2016-08-20 MED ORDER — ZOLPIDEM TARTRATE 5 MG PO TABS: 5.0000 mg | ORAL_TABLET | Freq: Every evening | ORAL | Status: DC | PRN

## 2016-08-20 MED ORDER — OXYTOCIN 40 UNITS IN LACTATED RINGERS INFUSION - SIMPLE MED
2.5000 [IU]/h | INTRAVENOUS | Status: DC
  Filled 2016-08-20: qty 1000

## 2016-08-20 MED ORDER — ONDANSETRON HCL 4 MG/2ML IJ SOLN: 4.0000 mg | Freq: Four times a day (QID) | INTRAMUSCULAR | Status: DC | PRN

## 2016-08-20 MED ORDER — DIPHENHYDRAMINE HCL 25 MG PO CAPS
25.0000 mg | ORAL_CAPSULE | Freq: Four times a day (QID) | ORAL | Status: DC | PRN
Start: 2016-08-20 — End: 2016-08-22

## 2016-08-20 MED ORDER — FLEET ENEMA 7-19 GM/118ML RE ENEM: 1.0000 | ENEMA | RECTAL | Status: DC | PRN

## 2016-08-20 MED ORDER — IBUPROFEN 600 MG PO TABS
600.0000 mg | ORAL_TABLET | Freq: Four times a day (QID) | ORAL | Status: DC
  Administered 2016-08-20 – 2016-08-22 (×10): 600 mg via ORAL
  Filled 2016-08-20 (×10): qty 1

## 2016-08-20 MED ORDER — COCONUT OIL OIL: 1.0000 "application " | TOPICAL_OIL | Status: DC | PRN

## 2016-08-20 MED ORDER — LACTATED RINGERS IV SOLN
INTRAVENOUS | Status: DC
  Administered 2016-08-20: 02:00:00 via INTRAVENOUS

## 2016-08-20 MED ORDER — ACETAMINOPHEN 325 MG PO TABS: 650.0000 mg | ORAL_TABLET | ORAL | Status: DC | PRN

## 2016-08-20 MED ORDER — OXYCODONE-ACETAMINOPHEN 5-325 MG PO TABS: 2.0000 | ORAL_TABLET | ORAL | Status: DC | PRN

## 2016-08-20 MED ORDER — ONDANSETRON HCL 4 MG/2ML IJ SOLN: 4.0000 mg | INTRAMUSCULAR | Status: DC | PRN

## 2016-08-20 MED ORDER — INFLUENZA VAC SPLIT QUAD 0.5 ML IM SUSY: 0.5000 mL | PREFILLED_SYRINGE | INTRAMUSCULAR | Status: DC

## 2016-08-20 MED ORDER — LIDOCAINE HCL (PF) 1 % IJ SOLN
30.0000 mL | INTRAMUSCULAR | Status: DC | PRN
  Filled 2016-08-20: qty 30

## 2016-08-20 MED ORDER — OXYTOCIN BOLUS FROM INFUSION
500.0000 mL | Freq: Once | INTRAVENOUS | Status: AC
  Administered 2016-08-20: 500 mL via INTRAVENOUS

## 2016-08-20 MED ORDER — TETANUS-DIPHTH-ACELL PERTUSSIS 5-2.5-18.5 LF-MCG/0.5 IM SUSP: 0.5000 mL | Freq: Once | INTRAMUSCULAR | Status: DC

## 2016-08-20 MED ORDER — FENTANYL CITRATE (PF) 100 MCG/2ML IJ SOLN
100.0000 ug | INTRAMUSCULAR | Status: DC | PRN
  Filled 2016-08-20: qty 2

## 2016-08-20 MED ORDER — WITCH HAZEL-GLYCERIN EX PADS: 1.0000 "application " | MEDICATED_PAD | CUTANEOUS | Status: DC | PRN

## 2016-08-20 MED ORDER — BENZOCAINE-MENTHOL 20-0.5 % EX AERO: 1.0000 "application " | INHALATION_SPRAY | CUTANEOUS | Status: DC | PRN

## 2016-08-20 MED ORDER — PRENATAL MULTIVITAMIN CH
1.0000 | ORAL_TABLET | Freq: Every day | ORAL | Status: DC
  Administered 2016-08-20 – 2016-08-22 (×3): 1 via ORAL
  Filled 2016-08-20 (×3): qty 1

## 2016-08-20 MED ORDER — SENNOSIDES-DOCUSATE SODIUM 8.6-50 MG PO TABS
2.0000 | ORAL_TABLET | ORAL | Status: DC
  Administered 2016-08-20: 2 via ORAL
  Filled 2016-08-20 (×2): qty 2

## 2016-08-20 MED ORDER — DIBUCAINE 1 % RE OINT: 1.0000 "application " | TOPICAL_OINTMENT | RECTAL | Status: DC | PRN

## 2016-08-20 MED ORDER — SOD CITRATE-CITRIC ACID 500-334 MG/5ML PO SOLN: 30.0000 mL | ORAL | Status: DC | PRN

## 2016-08-20 NOTE — Lactation Note (Signed)
This note was copied from a baby's chart. Lactation Consultation Note  Patient Name: Girl Levonne HubertLoc Zahm NWGNF'AToday's Date: 08/20/2016 Reason for consult: Initial assessment  Visited with Mom, baby 6012 hrs old.  Mom speaks [place], but understands some [place].  Her sister-in-law is staying with her to help translate.  Mom an experienced BFer with her 3 prior children (all under 157 yrs old).  Mom has large diameter nipples.  Talked about importance of baby latching deeply on the breast  Hand expression demonstrated and encouraged to do prior to latching baby.  Assisted with positioning and support in cross cradle hold.  Mom needing guidance on supporting her breast with a U hold to facilitate a deeper latch.  Baby able to attain a deep latch, swallows heard.  Mom denies feeling any discomfort. Reviewed basics of importance of STS and feeding often on cue.  Brochure left in room.  Informed her of IP and OP lactation services available.  Encouraged Mom to call for assistance. Mom denies having any questions at present.  Consult Status Consult Status: Follow-up Date: 08/21/16 Follow-up type: In-patient    Judee ClaraSmith, Tocarra Gassen E 08/20/2016, 2:48 PM

## 2016-08-20 NOTE — Progress Notes (Signed)
CSW received consult for "hx of depression," however, CSW does not see depression noted in H&P, RN admission summary or in OB prenatal record.  CSW is screening out referral at this time.  Please contact CSW if concerns arise or by MOB's request.

## 2016-08-20 NOTE — MAU Note (Signed)
Ctxs since Friday. Stronger since 2300. Denies LOF or bleeding

## 2016-08-20 NOTE — H&P (Signed)
LABOR AND DELIVERY ADMISSION HISTORY AND PHYSICAL NOTE  Brandi HubertLoc Moccia is a 30 y.o. female [phone] with IUP at 6355w2d by 2nd trimester US presenting for SOL.  Speaks vietnamese. Was late to pre-natal care. Is moderately uncomfortable with contractions at this time. She reports positive fetal movement. She denies leakage of fluid or vaginal bleeding.  Prenatal History/Complications:  Past Medical History: Past Medical History:  Diagnosis Date  . Medical history non-contributory   . No pertinent past medical history     Past Surgical History: Past Surgical History:  Procedure Laterality Date  . NO PAST SURGERIES      Obstetrical History: OB History    Gravida Para Term Preterm AB Living   4 3 3  0 0 3   SAB TAB Ectopic Multiple Live Births   0 0 0 0 3      Social History: Social History   Social History  . Marital status: Married    Spouse name: N/A  . Number of children: N/A  . Years of education: N/A   Social History Main Topics  . Smoking status: Never Smoker  . Smokeless tobacco: Never Used  . Alcohol use No  . Drug use: No  . Sexual activity: Yes    Birth control/ protection: None   Other Topics Concern  . None   Social History Narrative  . None    Family History: History reviewed. No pertinent family history.  Allergies: No Known Allergies  Prescriptions Prior to Admission  Medication Sig Dispense Refill Last Dose  . ibuprofen (ADVIL,MOTRIN) 600 MG tablet Take 1 tablet (600 mg total) by mouth every 6 (six) hours. 30 tablet 0   . prenatal vitamin w/FE, FA (PRENATAL 1 + 1) 27-1 MG TABS Take 1 tablet by mouth daily.     12/08/2013 at Unknown time   Review of Systems   All systems reviewed and negative except as stated in HPI  Blood pressure 126/83, temperature 97.3 F (36.3 C), resp. rate 20, height 5' (1.524 m), weight 55.2 kg (121 lb 12.8 oz), unknown if currently breastfeeding. General appearance: alert, cooperative and no distress Lungs: no  respiratory distress Heart: regular rate Abdomen: soft, non-tender Extremities: No calf swelling or tenderness Presentation: cephalic by nurse exam Fetal monitoring: baseline 135, moderate variability. Accelerations present, no decels Uterine activity: q4 minutes Dilation: 5 Effacement (%): 90 Station: -1 Exam by:: Karl Itoiana Ansah-Mensah, rnc    Prenatal labs: ABO, Rh:  A + Antibody:   neg Rubella: immune RPR:   neg HBsAg:   neg HIV:   neg GBS: Positive (10/16 0000)   Prenatal Transfer Tool  Maternal Diabetes: No Genetic Screening: too late to care to get screening Maternal Ultrasounds/Referrals: Normal Fetal Ultrasounds or other Referrals:  Referred to Materal Fetal Medicine  Maternal Substance Abuse:  No Significant Maternal Medications:  None Significant Maternal Lab Results: None  No results found for this or any previous visit (from the past 24 hour(s)).  Patient Active Problem List   Diagnosis Date Noted  . Active labor 12/09/2013    Assessment: Brandi Turner is a 30 y.o. G4P3003 at 3655w2d here for SOL  #Labor: SOL #Pain: IV pain medicine only, no epidural #FWB: Category 1 tracing #ID:  GBS positive - ampicillin #MOF: breast #MOC:undecided #Circ:  n/a  Tillman SersAngela C Riccio, DO PGY-1 10/29/20171:23 AM   OB FELLOW HISTORY AND PHYSICAL ATTESTATION  I have seen and examined this patient; I agree with above documentation in the resident's note.  Ernestina PennaNicholas Amadi Yoshino 08/20/2016, 2:23 AM

## 2016-08-21 NOTE — Progress Notes (Signed)
Post Partum Day 1 Subjective: no complaints, up ad lib, voiding and tolerating PO  Objective: Blood pressure 114/68, pulse 71, temperature 97.4 F (36.3 C), temperature source Oral, resp. rate 18, height 5' (1.524 m), weight 121 lb 12.8 oz (55.2 kg), SpO2 99 %, unknown if currently breastfeeding.  Physical Exam:  General: alert, cooperative, appears stated age and no distress Lochia: appropriate Uterine Fundus: firm Incision: n/a DVT Evaluation: No evidence of DVT seen on physical exam.   Recent Labs  08/20/16 0130  HGB 12.0  HCT 35.0*    Assessment/Plan: Plan for discharge tomorrow   LOS: 1 day   Wyvonnia DuskyMarie Melania Kirks 08/21/2016, 4:24 AM

## 2016-08-21 NOTE — Lactation Note (Signed)
This note was copied from a baby's chart. Lactation Consultation Note  Patient Name: Brandi Levonne HubertLoc Brandi ZOXWR'UToday's Date: 08/21/2016 Reason for consult: Follow-up assessment Baby at 40 hr of life. Experienced bf Mom denies breast or nipple pain, voiced no concerns. Discussed baby behavior, feeding frequency, voids, wt loss, breast changes, and nipple care. Mom stated she can manually express and has spoon in room. She is aware of lactation services and support group. She will call as needed.    Maternal Data    Feeding Feeding Type: Breast Fed Length of feed: 17 min  LATCH Score/Interventions Latch: Grasps breast easily, tongue down, lips flanged, rhythmical sucking.  Audible Swallowing: Spontaneous and intermittent  Type of Nipple: Everted at rest and after stimulation  Comfort (Breast/Nipple): Soft / non-tender     Hold (Positioning): No assistance needed to correctly position infant at breast.  LATCH Score: 10  Lactation Tools Discussed/Used     Consult Status Consult Status: Follow-up Date: 08/22/16 Follow-up type: In-patient    Rulon Eisenmengerlizabeth E Katrina Daddona 08/21/2016, 6:57 PM

## 2016-08-21 NOTE — Progress Notes (Signed)
CM / UR chart review completed.  

## 2016-08-22 MED ORDER — IBUPROFEN 600 MG PO TABS: 600.0000 mg | ORAL_TABLET | Freq: Four times a day (QID) | ORAL | 0 refills | Status: DC

## 2016-08-22 MED ORDER — SENNOSIDES-DOCUSATE SODIUM 8.6-50 MG PO TABS: 2.0000 | ORAL_TABLET | Freq: Every day | ORAL | 0 refills | Status: DC

## 2016-08-22 NOTE — Lactation Note (Signed)
This note was copied from a baby's chart. Lactation Consultation Note: Patients sister-in-law at the bedside to interpret [place]. Mother states that breastfeeding if going good. Mother has large amts of hand expressed milk. Her breast are getting full. Encouraged to breastfeed infant 8-12 times in 24 hours and with all feeding cues.  Mother was given a hand pump to use as needed. Advised mother to massage and ice to prevent severe engorgement. Mother was receptive to all teaching. Mother is an experienced breastfeeding mother who breastfeed all other children for 2 years each.   Patient Name: Brandi Brandi HubertLoc Turner Brandi Turner: 08/22/2016     Maternal Data    Feeding    LATCH Score/Interventions                      Lactation Tools Discussed/Used     Consult Status      Stevan BornKendrick, Mei Suits McCoy 08/22/2016, 10:15 AM

## 2016-08-22 NOTE — Discharge Instructions (Signed)

## 2016-08-22 NOTE — Discharge Summary (Signed)
OB Discharge Summary     Patient Name: Brandi Turner DOB: 1986/04/09 MRN: [id number]  Date of admission: 08/20/2016 Delivering MD: Ernestina PennaSCHENK, NICHOLAS MICHAEL   Date of discharge: 08/22/2016  Admitting diagnosis: 38 weeks feeling alot of pain contractions Intrauterine pregnancy: 6569w2d     Secondary diagnosis:  Active Problems:   Normal labor  Additional problems: none     Discharge diagnosis: Term Pregnancy Delivered                                                                                                Post partum procedures:none  Augmentation: none  Complications: None  Hospital course:  Onset of Labor With Vaginal Delivery     30 y.o. yo [id number] at 1669w2d was admitted in Active Labor on 08/20/2016. Patient had an uncomplicated labor course as follows:  Membrane Rupture Time/Date: 2:03 AM ,08/20/2016   Intrapartum Procedures: Episiotomy: None [1]                                         Lacerations:  None [1]  Patient had a delivery of a Viable infant. 08/20/2016  Information for the patient's newborn:  Otilio CarpenKa, Girl Maryfrances [[id number]][[id number]]  Delivery Method: Vaginal, Spontaneous Delivery (Filed from Delivery Summary)    Pateint had an uncomplicated postpartum course.  She is ambulating, tolerating a regular diet, passing flatus, and urinating well. Patient is discharged home in stable condition on 08/22/16.    Physical exam  Vitals:   08/20/16 1700 08/21/16 0555 08/21/16 1736 08/22/16 0513  BP: 114/68 (!) 109/56 108/70 104/73  Pulse: 71 (!) 59 66 60  Resp: 18 18 16 16   Temp: 97.4 F (36.3 C) 98.5 F (36.9 C)  98.1 F (36.7 C)  TempSrc: Oral Oral  Oral  SpO2: 99%  99%   Weight:      Height:       General: alert, cooperative and no distress Lochia: appropriate Uterine Fundus: firm Incision: N/A DVT Evaluation: No evidence of DVT seen on physical exam. Labs: Lab Results  Component Value Date   WBC 8.5 08/20/2016   HGB 12.0 08/20/2016   HCT 35.0 (L) 08/20/2016   MCV  77.6 (L) 08/20/2016   PLT 219 08/20/2016   No flowsheet data found.  Discharge instruction: per After Visit Summary and "Baby and Me Booklet".  After visit meds:    Medication List    TAKE these medications   ibuprofen 600 MG tablet Commonly known as:  ADVIL,MOTRIN Take 1 tablet (600 mg total) by mouth every 6 (six) hours.   prenatal vitamin w/FE, FA 27-1 MG Tabs tablet Take 1 tablet by mouth daily.   senna-docusate 8.6-50 MG tablet Commonly known as:  Senokot-S Take 2 tablets by mouth at bedtime.       Diet: routine diet  Activity: Advance as tolerated. Pelvic rest for 6 weeks.   Outpatient follow up:6 weeks Follow up Appt:No future appointments. Follow up Visit:No Follow-up on file.  Postpartum contraception: Undecided  Newborn Data:  Live born female  Birth Weight: 6 lb 4.2 oz (2841 g) APGAR: 9, 9  Baby Feeding: Breast Disposition:home with mother   08/22/2016 Tillman SersAngela C Riccio, DO   OB FELLOW DISCHARGE ATTESTATION  I have seen and examined this patient and agree with above documentation in the resident's note.   Jen MowElizabeth Yelena Metzer, DO OB Fellow 9:33 AM

## 2018-04-18 ENCOUNTER — Other Ambulatory Visit: Payer: Self-pay

## 2018-04-18 ENCOUNTER — Ambulatory Visit: Payer: Self-pay | Admitting: Physician Assistant

## 2018-04-18 ENCOUNTER — Encounter: Payer: Self-pay | Admitting: Physician Assistant

## 2018-04-18 VITALS — BP 100/64 | HR 110 | Temp 98.6°F | Resp 18 | Ht 61.0 in | Wt 95.8 lb

## 2018-04-18 DIAGNOSIS — N61 Mastitis without abscess: Secondary | ICD-10-CM

## 2018-04-18 MED ORDER — CEPHALEXIN 500 MG PO CAPS: 500.0000 mg | ORAL_CAPSULE | Freq: Four times a day (QID) | ORAL | 0 refills | Status: AC

## 2018-04-18 NOTE — Patient Instructions (Addendum)
     IF you received an x-ray today, you will receive an invoice from [facility]. Please contact [facility] at [phone] with questions or concerns regarding your invoice.   IF you received labwork today, you will receive an invoice from [facility]. Please contact [facility] at [phone] with questions or concerns regarding your invoice.   Our billing staff will not be able to assist you with questions regarding bills from these companies.  You will be contacted with the lab results as soon as they are available. The fastest way to get your results is to activate your My Chart account. Instructions are located on the last page of this paperwork. If you have not heard from us regarding the results in 2 weeks, please contact this office.    Mastitis Mastitis is redness, soreness, and puffiness (inflammation) in an area of the breast. It is often caused by an infection that occurs when bacteria enter the skin. The infection is often helped by antibiotic medicine. Follow these instructions at home:  Only take medicines as told by your doctor.  If your doctor prescribed an antibiotic medicine, take it as told. Finish it even if you start to feel better.  Do not wear a tight or underwire bra. Wear a soft support bra.  Drink more fluids, especially if you have a fever.  If you are breastfeeding: ? Keep emptying the breast. Your doctor can tell you if the milk is safe. Use a breast pump if you are told to stop nursing. ? Keep your nipples clean and dry. ? Empty the first breast before going to the other breast. Use a breast pump if your baby is not emptying your breast. ? If you go back to work, pump your breasts while at work. ? Avoid letting your breasts get overly filled with milk (engorged). Contact a doctor if:  You have pus-like fluid leaking from your breast.  Your symptoms do not get better within 2 days. Get help right away if:  Your pain and puffiness are  getting worse.  Your pain is not helped by medicine.  You have a red line going from your breast toward your armpit.  You have a fever or lasting symptoms for more than 2-3 days.  You have a fever and your symptoms suddenly get worse. This information is not intended to replace advice given to you by your health care provider. Make sure you discuss any questions you have with your health care provider. Document Released: 09/27/2009 Document Revised: 03/16/2016 Document Reviewed: 05/09/2013 Elsevier Interactive Patient Education  2017 [employer]

## 2018-04-18 NOTE — Progress Notes (Signed)
Brandi Turner  MRN: [id number] DOB: [birth year]  PCP: Patient, No Pcp Per  Chief Complaint  Patient presents with  . Breast Pain    left breast has pain since tuesday wasnt sure if from heavy lifting     Subjective:  Brandi Turner is a 32 year old female presenting for evaluation of breast pain.  2 days ago pain in her left chest.  Prior to the pain she had been moving heavy stuff.  The pain has gotten much better but she wants to make sure everything is ok.  No breast pain or erythema.  No fevers or chills.  Tylenol and motrin helped with the pain but she has not needed any today.  She had a fever yesterday but has not noticed one today.  Breatfeeding 38 month old  History is obtained by patient through an interpretor.  Review of Systems  Constitutional: Positive for chills and fever.  Skin: Positive for color change.    There are no active problems to display for this patient.   Current Outpatient Medications on File Prior to Visit  Medication Sig Dispense Refill  . prenatal vitamin w/FE, FA (PRENATAL 1 + 1) 27-1 MG TABS Take 1 tablet by mouth daily.      No current facility-administered medications on file prior to visit.     No Known Allergies  Past Medical History:  Diagnosis Date  . Medical history non-contributory   . No pertinent past medical history    Social History   Social History Narrative   Lives with 4 children and husband   From [place] - to [place] 2011   Social History   Tobacco Use  . Smoking status: Never Smoker  . Smokeless tobacco: Never Used  Substance Use Topics  . Alcohol use: No  . Drug use: No   family history is not on file.     Objective:  BP 100/64   Pulse (!) 110   Temp 98.6 F (37 C) (Oral)   Resp 18   Ht 5\' 1"  (1.549 m)   Wt 95 lb 12.8 oz (43.5 kg)   SpO2 97%   BMI 18.10 kg/m  Body mass index is 18.1 kg/m.  Wt Readings from Last 3 Encounters:  04/18/18 95 lb 12.8 oz (43.5 kg)  08/20/16 121 lb 12.8 oz (55.2 kg)  08/18/16 123 lb  9.6 oz (56.1 kg)    Physical Exam  Constitutional: She is oriented to person, place, and time. She appears well-developed and well-nourished.  HENT:  Head: Normocephalic and atraumatic.  Right Ear: Hearing and external ear normal.  Left Ear: Hearing and external ear normal.  Eyes: Conjunctivae are normal.  Neck: Normal range of motion.  Cardiovascular: Normal rate, regular rhythm and normal heart sounds.  No murmur heard. Pulmonary/Chest: Effort normal and breath sounds normal. She has no wheezes. Right breast exhibits no inverted nipple, no mass, no nipple discharge, no skin change and no tenderness. Left breast exhibits skin change and tenderness. Left breast exhibits no inverted nipple, no mass and no nipple discharge.    Lymphadenopathy:    She has no axillary adenopathy.  Neurological: She is alert and oriented to person, place, and time.  Skin: Skin is warm, dry and intact. No rash noted. No pallor.  Psychiatric: She has a normal mood and affect. Her behavior is normal. Judgment and thought content normal.  Vitals reviewed.   Assessment and Plan :  Mastitis in female - Plan: cephALEXin (KEFLEX) 500 MG capsule -  warm compresses and abx started - continue to nurse baby - afebrile in office but if not improving in 48h rtc  - d/w pt how to hold the baby to increase suction at the area of the infected area to help remove the plug  Patient verbalized to me that they understand the following: diagnosis, what is being done for them, what to expect and what should be done at home.  Their questions have been answered.  See after visit summary for patient specific instructions.  Benny LennertSarah Shant Hence PA-C  [facility] at [facility] 04/18/2018 2:38 PM  Please note: Portions of this report may have been transcribed using dragon voice recognition software. Every effort was made to ensure accuracy; however, inadvertent computerized transcription errors may be present.

## 2018-04-20 ENCOUNTER — Encounter: Payer: Self-pay | Admitting: Family Medicine

## 2018-04-20 ENCOUNTER — Ambulatory Visit: Payer: Self-pay | Admitting: Family Medicine

## 2018-04-20 ENCOUNTER — Other Ambulatory Visit: Payer: Self-pay

## 2018-04-20 VITALS — BP 111/67 | HR 82 | Temp 97.8°F | Resp 16 | Ht 61.0 in | Wt 97.8 lb

## 2018-04-20 DIAGNOSIS — N61 Mastitis without abscess: Secondary | ICD-10-CM

## 2018-04-20 NOTE — Patient Instructions (Addendum)
     IF you received an x-ray today, you will receive an invoice from [facility]. Please contact [facility] at [phone] with questions or concerns regarding your invoice.   IF you received labwork today, you will receive an invoice from [facility]. Please contact [facility] at [phone] with questions or concerns regarding your invoice.   Our billing staff will not be able to assist you with questions regarding bills from these companies.  You will be contacted with the lab results as soon as they are available. The fastest way to get your results is to activate your My Chart account. Instructions are located on the last page of this paperwork. If you have not heard from us regarding the results in 2 weeks, please contact this office.     Lymphadenopathy Lymphadenopathy refers to swollen or enlarged lymph glands, also called lymph nodes. Lymph glands are part of your body's defense (immune) system, which protects the body from infections, germs, and diseases. Lymph glands are found in many locations in your body, including the neck, underarm, and groin. Many things can cause lymph glands to become enlarged. When your immune system responds to germs, such as viruses or bacteria, infection-fighting cells and fluid build up. This causes the glands to grow in size. Usually, this is not something to worry about. The swelling and any soreness often go away without treatment. However, swollen lymph glands can also be caused by a number of diseases. Your health care provider may do various tests to help determine the cause. If the cause of your swollen lymph glands cannot be found, it is important to monitor your condition to make sure the swelling goes away. Follow these instructions at home: Watch your condition for any changes. The following actions may help to lessen any discomfort you are feeling:  Get plenty of rest.  Take medicines only as directed by your health care  provider. Your health care provider may recommend over-the-counter medicines for pain.  Apply moist heat compresses to the site of swollen lymph nodes as directed by your health care provider. This can help reduce any pain.  Check your lymph nodes daily for any changes.  Keep all follow-up visits as directed by your health care provider. This is important.  Contact a health care provider if:  Your lymph nodes are still swollen after 2 weeks.  Your swelling increases or spreads to other areas.  Your lymph nodes are hard, seem fixed to the skin, or are growing rapidly.  Your skin over the lymph nodes is red and inflamed.  You have a fever.  You have chills.  You have fatigue.  You develop a sore throat.  You have abdominal pain.  You have weight loss.  You have night sweats. Get help right away if:  You notice fluid leaking from the area of the enlarged lymph node.  You have severe pain in any area of your body.  You have chest pain.  You have shortness of breath. This information is not intended to replace advice given to you by your health care provider. Make sure you discuss any questions you have with your health care provider. Document Released: 07/18/2008 Document Revised: 03/16/2016 Document Reviewed: 05/14/2014 Elsevier Interactive Patient Education  2018 Elsevier Inc.  

## 2018-04-20 NOTE — Progress Notes (Signed)
Subjective:  By signing my name below, I, Brandi Turner, attest that this documentation has been prepared under the direction and in the presence of Brandi SorensonEva Chasitie Passey, MD Electronically Signed: Charline BillsEssence Turner, ED Scribe 04/20/2018 at 10:38 AM.   Patient ID: Brandi Turner, female    DOB: 1986-10-06, 32 y.o.   MRN: [id number]  Chief Complaint  Patient presents with  . Follow-up    mastitis, feeling much better, no pain at all    HPI Brandi Turner Hammerstrom is a 32 y.o. female who presents to Primary Care at [facility] for f/u. Pt states that L breast pain and redness has improved significantly. She is doing fine on Keflex. She is still breastfeeding her 6719 m.o. daughter without any complications, majorly on the L breast which she states it larger than the R breast. Denies fever, chills, nausea, diarrhea, vaginal itching, vaginal discharge, new lumps/bumps in breast. Pt is R hand dominant.  Past Medical History:  Diagnosis Date  . Medical history non-contributory   . No pertinent past medical history    Past Surgical History:  Procedure Laterality Date  . NO PAST SURGERIES     Current Outpatient Medications on File Prior to Visit  Medication Sig Dispense Refill  . cephALEXin (KEFLEX) 500 MG capsule Take 1 capsule (500 mg total) by mouth 4 (four) times daily for 10 days. 40 capsule 0  . prenatal vitamin w/FE, FA (PRENATAL 1 + 1) 27-1 MG TABS Take 1 tablet by mouth daily.      No current facility-administered medications on file prior to visit.    No Known Allergies History reviewed. No pertinent family history. Social History   Socioeconomic History  . Marital status: Married    Spouse name: Not on file  . Number of children: Not on file  . Years of education: Not on file  . Highest education level: Not on file  Occupational History  . Not on file  Social Needs  . Financial resource strain: Not on file  . Food insecurity:    Worry: Not on file    Inability: Not on file  . Transportation needs:    Medical:  Not on file    Non-medical: Not on file  Tobacco Use  . Smoking status: Never Smoker  . Smokeless tobacco: Never Used  Substance and Sexual Activity  . Alcohol use: No  . Drug use: No  . Sexual activity: Yes    Birth control/protection: None  Lifestyle  . Physical activity:    Days per week: Not on file    Minutes per session: Not on file  . Stress: Not on file  Relationships  . Social connections:    Talks on phone: Not on file    Gets together: Not on file    Attends religious service: Not on file    Active member of club or organization: Not on file    Attends meetings of clubs or organizations: Not on file    Relationship status: Not on file  Other Topics Concern  . Not on file  Social History Narrative   Lives with 4 children and husband   From [place]   [facility] 04/20/2018 04/18/2018  Decreased Interest 0 0  Down, Depressed, Hopeless 0 0  PHQ - 2 Score 0 0   Review of Systems  Constitutional: Negative for chills and fever.  Gastrointestinal: Negative for diarrhea, nausea and vomiting.  Genitourinary: Negative for vaginal discharge.  Skin: Negative for color  change.      Objective:   Physical Exam  Constitutional: She is oriented to person, place, and time. She appears well-developed and well-nourished. No distress.  HENT:  Head: Normocephalic and atraumatic.  Eyes: Conjunctivae and EOM are normal.  Neck: Neck supple. No tracheal deviation present. No thyroid mass and no thyromegaly present.  Cardiovascular: Normal rate and regular rhythm.  Pulmonary/Chest: Effort normal and breath sounds normal. No respiratory distress.  Musculoskeletal: Normal range of motion.  Lymphadenopathy:    She has no cervical adenopathy.  Nontender mobile poorly defines L axillary lymph nodes in upper outer quad of L breast.  Neurological: She is alert and oriented to person, place, and time.  Skin: Skin is warm and dry.  Psychiatric: She has a normal  mood and affect. Her behavior is normal.  Nursing note and vitals reviewed.  BP 111/67   Pulse 82   Temp 97.8 F (36.6 C)   Resp 16   Ht 5\' 1"  (1.549 m)   Wt 97 lb 12.8 oz (44.4 kg)   SpO2 98%   BMI 18.48 kg/m    Assessment & Plan:   1. Mastitis in female   Resolving - recheck again in 2-4 weeks to ensure mass completely resolved - if not - will need diag mam and [place].   I personally performed the services described in this documentation, which was scribed in my presence. The recorded information has been reviewed and considered, and addended by me as needed.   Brandi Turner, M.D.  Primary Care at [facility] [address] [place], [place] [address] [phone] phone [phone] fax  08/04/18 5:34 PM

## 2018-05-09 ENCOUNTER — Other Ambulatory Visit: Payer: Self-pay

## 2018-05-09 ENCOUNTER — Encounter: Payer: Self-pay | Admitting: Physician Assistant

## 2018-05-09 ENCOUNTER — Ambulatory Visit: Payer: Self-pay | Admitting: Physician Assistant

## 2018-05-09 VITALS — BP 102/70 | HR 90 | Temp 98.2°F | Resp 18 | Ht 61.0 in | Wt 99.2 lb

## 2018-05-09 DIAGNOSIS — N61 Mastitis without abscess: Secondary | ICD-10-CM

## 2018-05-09 NOTE — Patient Instructions (Signed)
     IF you received an x-ray today, you will receive an invoice from [facility]. Please contact [facility] at [phone] with questions or concerns regarding your invoice.   IF you received labwork today, you will receive an invoice from [facility]. Please contact [facility] at [phone] with questions or concerns regarding your invoice.   Our billing staff will not be able to assist you with questions regarding bills from these companies.  You will be contacted with the lab results as soon as they are available. The fastest way to get your results is to activate your My Chart account. Instructions are located on the last page of this paperwork. If you have not heard from us regarding the results in 2 weeks, please contact this office.     

## 2018-05-09 NOTE — Progress Notes (Signed)
   Levonne HubertLoc Morneault  MRN: [id number] DOB: 10-14-1986  PCP: Patient, No Pcp Per  Chief Complaint  Patient presents with  . mastitis    follow up     Subjective:  Pt presents to clinic to make sure her mastitis has resolved.  She is having no redness and no pain and feels good.  She has finished the abx.   History is obtained by patient and her interpretor.  Review of Systems  Constitutional: Negative for chills and fever.    There are no active problems to display for this patient.   Current Outpatient Medications on File Prior to Visit  Medication Sig Dispense Refill  . prenatal vitamin w/FE, FA (PRENATAL 1 + 1) 27-1 MG TABS Take 1 tablet by mouth daily.      No current facility-administered medications on file prior to visit.     No Known Allergies  Past Medical History:  Diagnosis Date  . Medical history non-contributory   . No pertinent past medical history    Social History   Social History Narrative   Lives with 4 children and husband   From [place]   Social History   Tobacco Use  . Smoking status: Never Smoker  . Smokeless tobacco: Never Used  Substance Use Topics  . Alcohol use: No  . Drug use: No   family history is not on file.     Objective:  BP 102/70   Pulse 90   Temp 98.2 F (36.8 C) (Oral)   Resp 18   Ht 5\' 1"  (1.549 m)   Wt 99 lb 3.2 oz (45 kg)   SpO2 98%   BMI 18.74 kg/m  Body mass index is 18.74 kg/m.  Wt Readings from Last 3 Encounters:  05/09/18 99 lb 3.2 oz (45 kg)  04/20/18 97 lb 12.8 oz (44.4 kg)  04/18/18 95 lb 12.8 oz (43.5 kg)    Physical Exam  Pulmonary/Chest: Right breast exhibits no inverted nipple, no mass, no nipple discharge, no skin change and no tenderness. Left breast exhibits no inverted nipple, no mass, no nipple discharge, no skin change and no tenderness.    Assessment and Plan :  Mastitis in female - resolved - anticipatory guidance given to patient for if she gets a plugged duct and how to  prevent this in the future.  Patient verbalized to me that they understand the following: diagnosis, what is being done for them, what to expect and what should be done at home.  Their questions have been answered.  See after visit summary for patient specific instructions.  Benny LennertSarah Jomes Giraldo PA-C  Primary Care at [facility] 05/09/2018 8:33 AM  Please note: Portions of this report may have been transcribed using dragon voice recognition software. Every effort was made to ensure accuracy; however, inadvertent computerized transcription errors may be present.

## 2020-02-23 ENCOUNTER — Ambulatory Visit: Payer: Medicaid Other | Attending: Internal Medicine

## 2020-02-23 DIAGNOSIS — Z23 Encounter for immunization: Secondary | ICD-10-CM

## 2020-02-23 NOTE — Progress Notes (Signed)
   Covid-19 Vaccination Clinic  Name:  Brandi Turner    MRN: [id number] DOB: 12-13-1985  02/23/2020  Ms. Study was observed post Covid-19 immunization for 15 minutes without incident. She was provided with Vaccine Information Sheet and instruction to access the V-Safe system.   [name] was instructed to call 911 with any severe reactions post vaccine: Marland Kitchen Difficulty breathing  . Swelling of face and throat  . A fast heartbeat  . A bad rash all over body  . Dizziness and weakness   Immunizations Administered    Name Date Dose VIS Date Route   Pfizer COVID-19 Vaccine 02/23/2020  9:43 AM 0.3 mL 12/17/2018 Intramuscular   Manufacturer: [employer], [employer]   Lot: [id number]   NDC: [address]-4

## 2020-03-15 ENCOUNTER — Ambulatory Visit: Payer: Medicaid Other | Attending: Internal Medicine

## 2020-03-15 DIAGNOSIS — Z23 Encounter for immunization: Secondary | ICD-10-CM

## 2020-03-15 NOTE — Progress Notes (Signed)
   Covid-19 Vaccination Clinic  Name:  Nashalie Sallis    MRN: [id number] DOB: 1986-02-26  03/15/2020  Ms. Sassone was observed post Covid-19 immunization for 15 minutes without incident. She was provided with Vaccine Information Sheet and instruction to access the V-Safe system.   Ms. Puleo was instructed to call 911 with any severe reactions post vaccine: Marland Kitchen Difficulty breathing  . Swelling of face and throat  . A fast heartbeat  . A bad rash all over body  . Dizziness and weakness   Immunizations Administered    Name Date Dose VIS Date Route   Pfizer COVID-19 Vaccine 03/15/2020  8:43 AM 0.3 mL 12/17/2018 Intramuscular   Manufacturer: [employer], [employer]   Lot: [id number]   NDC: [address]-6

## 2023-04-27 ENCOUNTER — Ambulatory Visit (HOSPITAL_COMMUNITY)
Admission: EM | Admit: 2023-04-27 | Discharge: 2023-04-27 | Disposition: A | Discharge status: Home / Self Care | Payer: Medicaid Other

## 2023-04-27 ENCOUNTER — Encounter (HOSPITAL_COMMUNITY): Payer: Self-pay

## 2023-04-27 DIAGNOSIS — A084 Viral intestinal infection, unspecified: Secondary | ICD-10-CM

## 2023-04-27 NOTE — Discharge Instructions (Addendum)
Brandi Turner, k?t qu? khm s?c kh?e c?a b?n kh ?ng tin c?y. Ti tin r?ng c?n ?au b?ng v tiu ch?y c?a b?n l do b?nh do vi-rt. Hy ??m b?o r?ng b?n u?ng ?? n??c v?i t nh?t 64 ounce n??c m?i ngy. B?n c th? cn nh?c b? sung dung d?ch ?i?n gi?i nh? Pedialyte, Gatorade ho?c d?ch truy?n t?nh m?ch. Chng ti c?ng ?? xu?t ch? ?? ?n nh?t v?i c?m tr?ng, bnh m n??ng, chu?i v s?t to. B?n c th? ti?p t?c dng thu?c khng k ??n ?? ?i?u tr? ?au d? dy. Cc tri?u ch?ng c?a b?n s? c?i thi?n trong 3 ??n 5 ngy ti?p theo khi b?nh ny qua ?i.  Vui lng quay l?i phng khm n?u b?n b? nn, c mu trong phn, s?t ho?c b?t k? tri?u ch?ng m?i ?ng lo ng?i no.  Overall your physical exam is reassuring.  I believe your abdominal pain and diarrhea are due to a viral illness.  Please ensure you are staying hydrated with at least 64 ounces of water daily.  You can consider adding an electrolyte solution like Pedialyte, Gatorade or liquid IV.  We also suggest a bland diet of white rice, toast, bananas and applesauce.  You can continue taking your over-the-counter medications for stomach pain.  Your symptoms should improve over the next 3 to 5 days as this illness passes.  Please return to clinic if you develop any vomiting, blood in your stool, fever, or any new concerning symptoms.

## 2023-04-27 NOTE — ED Triage Notes (Signed)
Abdominal pain onset Monday night. No nausea or emesis, constipation from Monday to yesterday. Had a fully liquid BM this morning.   No one with similar symptoms. No med or diet changes. Patient ate some Watermelon and a vegetable from their home country and shrimp before getting sick.

## 2023-04-27 NOTE — ED Provider Notes (Signed)
[facility]    CSN: [id number] Arrival date & time: 04/27/23  0806      History   Chief Complaint Chief Complaint  Patient presents with   Abdominal Pain    HPI Brandi Turner is a 37 y.o. female.   Patient presents to clinic with her sister in law for complaints of abdominal pain that started Monday and diarrhea that started today.  She has had 2 episodes of watery diarrhea, nonbloody.  She denies any nausea, vomiting or fever.  No recent sick contacts.  Thinks she might of ate something bad.  Today, overall she feels much better.   She has no significant medical history and has had no prior surgeries.  Sister in law to translate from [place] to [place] patient declined clinic provided medical interpreter.  The history is provided by the patient and medical records. The history is limited by a language barrier. A language interpreter was used (sister-in-law chose to interpret).  Abdominal Pain Associated symptoms: diarrhea   Associated symptoms: no chest pain, no cough, no dysuria, no fever, no shortness of breath and no vomiting     Past Medical History:  Diagnosis Date   Medical history non-contributory    No pertinent past medical history     There are no problems to display for this patient.   Past Surgical History:  Procedure Laterality Date   NO PAST SURGERIES      OB History     Gravida  4   Para  4   Term  4   Preterm  0   AB  0   Living  4      SAB  0   IAB  0   Ectopic  0   Multiple  0   Live Births  4            Home Medications    Prior to Admission medications   Not on File    Family History History reviewed. No pertinent family history.  Social History Social History   Tobacco Use   Smoking status: Never   Smokeless tobacco: Never  Substance Use Topics   Alcohol use: No   Drug use: No     Allergies   Patient has no known allergies.   Review of Systems Review of Systems  Constitutional:   Negative for fever.  Respiratory:  Negative for cough and shortness of breath.   Cardiovascular:  Negative for chest pain.  Gastrointestinal:  Positive for abdominal pain and diarrhea. Negative for vomiting.  Genitourinary:  Negative for dysuria.     Physical Exam Triage Vital Signs ED Triage Vitals  Enc Vitals Group     BP 04/27/23 0837 117/78     Pulse Rate 04/27/23 0837 78     Resp 04/27/23 0837 16     Temp 04/27/23 0837 98.1 F (36.7 C)     Temp Source 04/27/23 0837 Oral     SpO2 04/27/23 0837 96 %     Weight --      Height 04/27/23 0836 5\' 1"  (1.549 m)     Head Circumference --      Peak Flow --      Pain Score 04/27/23 0834 1     Pain Kendra --      Pain Edu? --      Excl. in GC? --    No data found.  Updated Vital Signs BP 117/78 (BP Location: Right Arm)   Pulse 78  Temp 98.1 F (36.7 C) (Oral)   Resp 16   Ht 5\' 1"  (1.549 m)   LMP 04/16/2023 (Approximate)   SpO2 96%   Breastfeeding No   BMI 18.74 kg/m   Visual Acuity Right Eye Distance:   Left Eye Distance:   Bilateral Distance:    Right Eye Near:   Left Eye Near:    Bilateral Near:     Physical Exam Vitals and nursing note reviewed.  Constitutional:      Appearance: Normal appearance. She is well-developed.  HENT:     Head: Normocephalic and atraumatic.     Right Ear: External ear normal.     Left Ear: External ear normal.     Nose: Nose normal.     Mouth/Throat:     Mouth: Mucous membranes are moist.     Pharynx: Oropharynx is clear.  Eyes:     General: No scleral icterus. Cardiovascular:     Rate and Rhythm: Normal rate and regular rhythm.     Heart sounds: Normal heart sounds. No murmur heard. Pulmonary:     Effort: Pulmonary effort is normal. No respiratory distress.     Breath sounds: Normal breath sounds.  Abdominal:     General: Abdomen is flat. Bowel sounds are normal.     Palpations: Abdomen is soft.     Tenderness: There is abdominal tenderness in the right lower quadrant.  There is no guarding or rebound.     Hernia: No hernia is present.  Skin:    General: Skin is warm and dry.  Neurological:     General: No focal deficit present.     Mental Status: She is alert and oriented to person, place, and time.  Psychiatric:        Mood and Affect: Mood normal.      UC Treatments / Results  Labs (all labs ordered are listed, but only abnormal results are displayed) Labs Reviewed - No data to display  EKG   Radiology No results found.  Procedures Procedures (including critical care time)  Medications Ordered in UC Medications - No data to display  Initial Impression / Assessment and Plan / UC Course  I have reviewed the triage vital signs and the nursing notes.  Pertinent labs & imaging results that were available during my care of the patient were reviewed by me and considered in my medical decision making (see chart for details).  Vitals and triage reviewed, patient is hemodynamically stable.  Abdomen is soft with mild tenderness in right lower quadrant.  Without guarding, rebound or palpable masses.  Low concern for acute abdomen or emergent abnormalities.  Suspect mild tenderness due to diarrhea and abdominal cramping, most likely due to viral gastroenteritis.  Symptomatic management with hydration and bland diet discussed.  Return and follow-up precautions given, no questions at this time.    Final Clinical Impressions(s) / UC Diagnoses   Final diagnoses:  Viral gastroenteritis     Discharge Instructions      Nhn chung, k?t qu? khm s?c kh?e c?a b?n kh ?ng tin c?y. Ti tin r?ng c?n ?au b?ng v tiu ch?y c?a b?n l do b?nh do vi-rt. Hy ??m b?o r?ng b?n u?ng ?? n??c v?i t nh?t 64 ounce n??c m?i ngy. B?n c th? cn nh?c b? sung dung d?ch ?i?n gi?i nh? Pedialyte, Gatorade ho?c d?ch truy?n t?nh m?ch. Chng ti c?ng ?? xu?t ch? ?? ?n nh?t v?i c?m tr?ng, bnh m n??ng, chu?i v s?t to. B?n c  th? ti?p t?c dng thu?c khng k ??n ?? ?i?u tr?  ?au d? dy. Cc tri?u ch?ng c?a b?n s? c?i thi?n trong 3 ??n 5 ngy ti?p theo khi b?nh ny qua ?i.  Vui lng quay l?i phng khm n?u b?n b? nn, c mu trong phn, s?t ho?c b?t k? tri?u ch?ng m?i ?ng lo ng?i no.  Overall your physical exam is reassuring.  I believe your abdominal pain and diarrhea are due to a viral illness.  Please ensure you are staying hydrated with at least 64 ounces of water daily.  You can consider adding an electrolyte solution like Pedialyte, Gatorade or liquid IV.  We also suggest a bland diet of white rice, toast, bananas and applesauce.  You can continue taking your over-the-counter medications for stomach pain.  Your symptoms should improve over the next 3 to 5 days as this illness passes.  Please return to clinic if you develop any vomiting, blood in your stool, fever, or any new concerning symptoms.     ED Prescriptions   None    PDMP not reviewed this encounter.   Rinaldo Ratel [place], [place] 04/27/23 [phone]

## 2023-05-03 ENCOUNTER — Ambulatory Visit (HOSPITAL_COMMUNITY)
Admission: EM | Admit: 2023-05-03 | Discharge: 2023-05-03 | Disposition: A | Discharge status: Home / Self Care | Payer: Medicaid Other | Attending: Internal Medicine | Admitting: Internal Medicine

## 2023-05-03 ENCOUNTER — Encounter (HOSPITAL_COMMUNITY): Payer: Self-pay | Admitting: *Deleted

## 2023-05-03 DIAGNOSIS — K2970 Gastritis, unspecified, without bleeding: Secondary | ICD-10-CM

## 2023-05-03 DIAGNOSIS — K2990 Gastroduodenitis, unspecified, without bleeding: Secondary | ICD-10-CM

## 2023-05-03 MED ORDER — LIDOCAINE VISCOUS HCL 2 % MT SOLN
OROMUCOSAL | Status: AC
  Filled 2023-05-03: qty 15

## 2023-05-03 MED ORDER — ALUM & MAG HYDROXIDE-SIMETH 200-200-20 MG/5ML PO SUSP
ORAL | Status: AC
  Filled 2023-05-03: qty 30

## 2023-05-03 MED ORDER — LIDOCAINE VISCOUS HCL 2 % MT SOLN
15.0000 mL | Freq: Once | OROMUCOSAL | Status: AC
  Administered 2023-05-03: 15 mL via OROMUCOSAL

## 2023-05-03 MED ORDER — FAMOTIDINE 40 MG PO TABS: 40.0000 mg | ORAL_TABLET | Freq: Every day | ORAL | 0 refills | Status: DC

## 2023-05-03 MED ORDER — ALUM & MAG HYDROXIDE-SIMETH 200-200-20 MG/5ML PO SUSP
30.0000 mL | Freq: Once | ORAL | Status: AC
  Administered 2023-05-03: 30 mL via ORAL

## 2023-05-03 NOTE — ED Triage Notes (Signed)
Pt states that starting yesterday she developed upper abdominal pain that only happens when she is standing or taking a deep breath. She states that she hasn't tried any meds for this. She states the pain is a tightness feeling. She has her sister in law helping with the translation.

## 2023-05-03 NOTE — ED Provider Notes (Signed)
[facility]    CSN: [id number] Arrival date & time: 05/03/23  1834      History   Chief Complaint Chief Complaint  Patient presents with   Abdominal Pain    HPI Brandi Turner is a 37 y.o. female who presents stating since yesterday she has been having epigastric abdomina pain that is provoked when she sits and occurs after eating. Not related to any particular food. Has not tried any medications for this. The pain is described as tightness. She had a GI virus last week and seemed to recover well. Denies GERD, nausea, fever, constipation, black stools or blood in stools, or diarrhea.     Past Medical History:  Diagnosis Date   Medical history non-contributory    No pertinent past medical history     There are no problems to display for this patient.   Past Surgical History:  Procedure Laterality Date   NO PAST SURGERIES      OB History     Gravida  4   Para  4   Term  4   Preterm  0   AB  0   Living  4      SAB  0   IAB  0   Ectopic  0   Multiple  0   Live Births  4            Home Medications    Prior to Admission medications   Medication Sig Start Date End Date Taking? Authorizing Provider  famotidine (PEPCID) 40 MG tablet Take 1 tablet (40 mg total) by mouth daily. 05/03/23  Yes Rodriguez-Southworth, Viviana Simpler    Family History History reviewed. No pertinent family history.  Social History Social History   Tobacco Use   Smoking status: Never   Smokeless tobacco: Never  Vaping Use   Vaping status: Never Used  Substance Use Topics   Alcohol use: No   Drug use: No     Allergies   Patient has no known allergies.   Review of Systems Review of Systems  As noted in HPI Physical Exam Triage Vital Signs ED Triage Vitals  Encounter Vitals Group     BP 05/03/23 1851 104/74     Systolic BP Percentile --      Diastolic BP Percentile --      Pulse Rate 05/03/23 1851 81     Resp 05/03/23 1851 18     Temp 05/03/23  1851 97.9 F (36.6 C)     Temp Source 05/03/23 1851 Oral     SpO2 05/03/23 1851 98 %     Weight --      Height --      Head Circumference --      Peak Flow --      Pain Score 05/03/23 1849 0     Pain Alaiya --      Pain Education --      Exclude from Growth Chart --    No data found.  Updated Vital Signs BP 104/74 (BP Location: Right Arm)   Pulse 81   Temp 97.9 F (36.6 C) (Oral)   Resp 18   LMP 04/16/2023 (Approximate)   SpO2 98%   Visual Acuity Right Eye Distance:   Left Eye Distance:   Bilateral Distance:    Right Eye Near:   Left Eye Near:    Bilateral Near:     Physical Exam Vitals and nursing note reviewed.  Constitutional:  General: She is not in acute distress.    Appearance: She is normal weight. She is not toxic-appearing.  HENT:     Right Ear: External ear normal.     Left Ear: External ear normal.  Eyes:     General: No scleral icterus.    Conjunctiva/sclera: Conjunctivae normal.  Pulmonary:     Effort: Pulmonary effort is normal.  Abdominal:     General: Abdomen is flat. Bowel sounds are normal.     Palpations: Abdomen is soft.     Tenderness: There is abdominal tenderness in the epigastric area.  Musculoskeletal:        General: Normal range of motion.     Cervical back: Neck supple.  Skin:    General: Skin is warm and dry.     Findings: No rash.  Neurological:     Mental Status: She is alert and oriented to person, place, and time.     Gait: Gait normal.  Psychiatric:        Mood and Affect: Mood normal.        Behavior: Behavior normal.        Thought Content: Thought content normal.        Judgment: Judgment normal.      UC Treatments / Results  Labs (all labs ordered are listed, but only abnormal results are displayed) Labs Reviewed - No data to display  EKG   Radiology No results found.  Procedures Procedures (including critical care time)  Medications Ordered in UC Medications  alum & mag hydroxide-simeth  (MAALOX/MYLANTA) 200-200-20 MG/5ML suspension 30 mL (30 mLs Oral Given 05/03/23 1906)  lidocaine (XYLOCAINE) 2 % viscous mouth solution 15 mL (15 mLs Mouth/Throat Given 05/03/23 1907)    Initial Impression / Assessment and Plan / UC Course  I have reviewed the triage vital signs and the nursing notes.  Pt was given a GI cocktail which helped resolve her epigastric pain  Gastritis  I placed her on Pepsid 40 mg every day x 2 weeks If symptoms persists, needs to FU with GI    Final Clinical Impressions(s) / UC Diagnoses   Final diagnoses:  Gastritis and gastroduodenitis     Discharge Instructions      Follow up with the stomach doctor if you dont get better with the medication or the symptoms come back     ED Prescriptions     Medication Sig Dispense Auth. Provider   famotidine (PEPCID) 40 MG tablet Take 1 tablet (40 mg total) by mouth daily. 14 tablet Rodriguez-Southworth, Nettie Elm, PA-C      PDMP not reviewed this encounter.   Garey Ham, [place] 05/04/23 [phone]

## 2023-05-03 NOTE — Discharge Instructions (Signed)
Follow up with the stomach doctor if you dont get better with the medication or the symptoms come back

## 2023-05-05 ENCOUNTER — Emergency Department (HOSPITAL_COMMUNITY)
Admission: EM | Admit: 2023-05-05 | Discharge: 2023-05-05 | Disposition: A | Discharge status: Home / Self Care | Payer: Medicaid Other | Attending: Emergency Medicine | Admitting: Emergency Medicine

## 2023-05-05 ENCOUNTER — Emergency Department (HOSPITAL_COMMUNITY): Payer: Medicaid Other

## 2023-05-05 ENCOUNTER — Encounter (HOSPITAL_COMMUNITY): Payer: Self-pay | Admitting: *Deleted

## 2023-05-05 ENCOUNTER — Other Ambulatory Visit: Payer: Self-pay

## 2023-05-05 DIAGNOSIS — R1013 Epigastric pain: Secondary | ICD-10-CM | POA: Diagnosis not present

## 2023-05-05 DIAGNOSIS — R197 Diarrhea, unspecified: Secondary | ICD-10-CM | POA: Diagnosis not present

## 2023-05-05 DIAGNOSIS — R109 Unspecified abdominal pain: Secondary | ICD-10-CM | POA: Diagnosis present

## 2023-05-05 LAB — URINALYSIS, ROUTINE W REFLEX MICROSCOPIC
Bilirubin Urine: NEGATIVE
Glucose, UA: NEGATIVE mg/dL
Hgb urine dipstick: NEGATIVE
Ketones, ur: 5 mg/dL — AB
Nitrite: NEGATIVE
Protein, ur: NEGATIVE mg/dL
Specific Gravity, Urine: 1.011 (ref 1.005–1.030)
pH: 6 (ref 5.0–8.0)

## 2023-05-05 LAB — COMPREHENSIVE METABOLIC PANEL
ALT: 16 U/L (ref 0–44)
AST: 20 U/L (ref 15–41)
Albumin: 4.4 g/dL (ref 3.5–5.0)
Alkaline Phosphatase: 39 U/L (ref 38–126)
Anion gap: 10 (ref 5–15)
BUN: 8 mg/dL (ref 6–20)
CO2: 21 mmol/L — ABNORMAL LOW (ref 22–32)
Calcium: 9.4 mg/dL (ref 8.9–10.3)
Chloride: 103 mmol/L (ref 98–111)
Creatinine, Ser: 0.65 mg/dL (ref 0.44–1.00)
GFR, Estimated: >60 mL/min (ref >60)
Glucose, Bld: 109 mg/dL — ABNORMAL HIGH (ref 70–99)
Potassium: 3.4 mmol/L — ABNORMAL LOW (ref 3.5–5.1)
Sodium: 134 mmol/L — ABNORMAL LOW (ref 135–145)
Total Bilirubin: 0.4 mg/dL (ref 0.3–1.2)
Total Protein: 7.7 g/dL (ref 6.5–8.1)

## 2023-05-05 LAB — CBC
HCT: 36.5 % (ref 36.0–46.0)
Hemoglobin: 12 g/dL (ref 12.0–15.0)
MCH: 26.3 pg (ref 26.0–34.0)
MCHC: 32.9 g/dL (ref 30.0–36.0)
MCV: 80 fL (ref 80.0–100.0)
Platelets: 354 10*3/uL (ref 150–400)
RBC: 4.56 MIL/uL (ref 3.87–5.11)
RDW: 12.5 % (ref 11.5–15.5)
WBC: 5.4 10*3/uL (ref 4.0–10.5)
nRBC: 0 % (ref 0.0–0.2)

## 2023-05-05 LAB — LIPASE, BLOOD: Lipase: 41 U/L (ref 11–51)

## 2023-05-05 LAB — HCG, SERUM, QUALITATIVE: Preg, Serum: NEGATIVE

## 2023-05-05 MED ORDER — IOHEXOL 350 MG/ML SOLN
75.0000 mL | Freq: Once | INTRAVENOUS | Status: AC | PRN
  Administered 2023-05-05: 75 mL via INTRAVENOUS

## 2023-05-05 MED ORDER — PANTOPRAZOLE SODIUM 40 MG PO TBEC: 40.0000 mg | DELAYED_RELEASE_TABLET | Freq: Every day | ORAL | 0 refills | Status: DC

## 2023-05-05 NOTE — Discharge Instructions (Addendum)
K?t qu? xt nghi?m v ch?p CT c?a b?n khng c k?t qu? g ?ng lo ng?i.  Trnh ?n ?? cay, ?? ?n c tnh axit, caffeine v ?i?u ny c th? khi?n c?n ?au b?ng c?a b?n tr? nn tr?m tr?ng h?n.  Vui lng ti?p t?c dng famotidine m b?n ? nh?n ???c khi ch?m [place] kh?n c?p.  B?n ? ???c k ??n thu?c khc ?? dng, Protonix (Pantoprazole).  Hy dng thu?c ny m?i ngy trong 8 tu?n ti?p theo.  Vui lng lin h? v?i v?n phng bc s? tiu ha ???c li?t k ? trn ho?c m?t c? quan khc m b?n l?a ch?n ?? theo di h? v? c?n ?au b?ng c?a b?n.  Quay l?i phng c?p c?u n?u b?n b? nn m?a ho?c tiu ch?y khng ki?m sot, c mu trong phn ho?c nn m?a, phn ?en, ?au b?ng d? d?i, chng m?t, ?au ng?c ho?c kh th?.   Your lab work and CT scan have no concerning findings.  Avoid eating spicy foods, acidic foods, caffeine as this may make your abdominal pain worse.  Please continue taking the famotidine you received at urgent care.  You have been prescribed another medication to take, Protonix (Pantoprazole).  Please take this medication every day for the next 8 weeks.  Please contact the gastroenterologist office listed above, or another one of your choice, to follow-up with them about your abdominal pain.  Return to the ER if you have uncontrolled vomiting or diarrhea, blood in your stool or vomit, black stools, you have severe abdominal pain, you become very dizzy, you experience chest pain or shortness of breath.

## 2023-05-05 NOTE — ED Triage Notes (Signed)
The pt has had abd pain for 2 weeks  no n or v   she and her family understands some english  she speaks vietnamese  lmp June 28th

## 2023-05-05 NOTE — ED Provider Notes (Signed)
[place] EMERGENCY DEPARTMENT AT [facility] Provider Note   CSN: [id number] Arrival date & time: 05/05/23  1521     History  Chief Complaint  Patient presents with   Abdominal Pain   History obtained through of a [place] interpreter  Brandi Turner is a 37 y.o. female who presents with concern for 6 days of abdominal pain.  The pain is always present but varies in severity.  She rates the pain as a 10 out of 10 when she was at home and now with a 8 out of 10.  She says the pain is more of a dull pain.  Sitting can make the pain worse, denies anything that makes the pain better.  No associated nausea or vomiting.  She has had 1 episode of diarrhea this morning that was nonbloody. No constipation.  Denies any hematuria, dysuria.  No fevers or chills at home.  She was seen at urgent care and given Maalox and famotidine which she does not feel improved her symptoms.  Denies any NSAID use.  No prior abdominal surgeries   Abdominal Pain      Home Medications Prior to Admission medications   Medication Sig Start Date End Date Taking? Authorizing Provider  pantoprazole (PROTONIX) 40 MG tablet Take 1 tablet (40 mg total) by mouth daily. 05/05/23 06/30/23 Yes Arabella Merles, PA-C  famotidine (PEPCID) 40 MG tablet Take 1 tablet (40 mg total) by mouth daily. 05/03/23   Rodriguez-Southworth, Nettie Elm, PA-C      Allergies    Patient has no known allergies.    Review of Systems   Review of Systems  Gastrointestinal:  Positive for abdominal pain.    Physical Exam Updated Vital Signs BP 119/75 (BP Location: Right Arm)   Pulse 72   Temp 98.5 F (36.9 C) (Oral)   Resp 16   Ht 5' (1.524 m)   Wt 45.4 kg   LMP 04/16/2023 (Approximate)   SpO2 100%   BMI 19.53 kg/m  Physical Exam Vitals and nursing note reviewed.  Constitutional:      General: She is not in acute distress.    Appearance: She is well-developed.  HENT:     Head: Normocephalic and atraumatic.  Eyes:      Conjunctiva/sclera: Conjunctivae normal.  Cardiovascular:     Rate and Rhythm: Normal rate and regular rhythm.     Heart sounds: No murmur heard. Pulmonary:     Effort: Pulmonary effort is normal. No respiratory distress.     Breath sounds: Normal breath sounds.  Abdominal:     General: Abdomen is flat. Bowel sounds are normal.     Palpations: Abdomen is soft.     Tenderness: There is abdominal tenderness in the epigastric area. There is no right CVA tenderness, left CVA tenderness or guarding. Negative signs include Murphy's sign and Rovsing's sign.     Hernia: No hernia is present.  Musculoskeletal:        General: No swelling.     Cervical back: Neck supple.  Skin:    General: Skin is warm and dry.     Capillary Refill: Capillary refill takes less than 2 seconds.  Neurological:     General: No focal deficit present.     Mental Status: She is alert.  Psychiatric:        Mood and Affect: Mood normal.     ED Results / Procedures / Treatments   Labs (all labs ordered are listed, but only abnormal results are displayed)  Labs Reviewed  COMPREHENSIVE METABOLIC PANEL - Abnormal; Notable for the following components:      Result Value   Sodium 134 (*)    Potassium 3.4 (*)    CO2 21 (*)    Glucose, Bld 109 (*)    All other components within normal limits  URINALYSIS, ROUTINE W REFLEX MICROSCOPIC - Abnormal; Notable for the following components:   Ketones, ur 5 (*)    Leukocytes,Ua TRACE (*)    Bacteria, UA RARE (*)    All other components within normal limits  LIPASE, BLOOD  CBC  HCG, SERUM, QUALITATIVE    EKG None  Radiology CT ABDOMEN PELVIS W CONTRAST  Result Date: 05/05/2023 CLINICAL DATA:  Abdominal pain, acute, nonlocalized EXAM: CT ABDOMEN AND PELVIS WITH CONTRAST TECHNIQUE: Multidetector CT imaging of the abdomen and pelvis was performed using the standard protocol following bolus administration of intravenous contrast. RADIATION DOSE REDUCTION: This exam was  performed according to the departmental dose-optimization program which includes automated exposure control, adjustment of the mA and/or kV according to patient size and/or use of iterative reconstruction technique. CONTRAST:  75mL OMNIPAQUE IOHEXOL 350 MG/ML SOLN COMPARISON:  None Available. FINDINGS: Lower chest: No acute abnormality. Hepatobiliary: No focal liver abnormality. No gallstones, gallbladder wall thickening, or pericholecystic fluid. No biliary dilatation. Pancreas: No focal lesion. Normal pancreatic contour. No surrounding inflammatory changes. No main pancreatic ductal dilatation. Spleen: Normal in size without focal abnormality. Adrenals/Urinary Tract: No adrenal nodule bilaterally. Bilateral kidneys enhance symmetrically. No hydronephrosis. No hydroureter. The urinary bladder is unremarkable. Stomach/Bowel: Stomach is within normal limits. No evidence of bowel wall thickening or dilatation. Tubular-like 8 mm in diameter possibly blind-ending structure within the right lower quadrant suggestive of the appendix with no definite periappendiceal fat stranding or wall discontinuity. No appendicolith. Vascular/Lymphatic: No abdominal aorta or iliac aneurysm. No abdominal, pelvic, or inguinal lymphadenopathy. Reproductive: Uterus and bilateral adnexa are unremarkable. Other: Nonspecific trace volume simple free fluid within the pelvis. No intraperitoneal free gas. No organized fluid collection. Musculoskeletal: No abdominal wall hernia or abnormality. No suspicious lytic or blastic osseous lesions. No acute displaced fracture. IMPRESSION: 1. The appendix is not definitely identified; however, a tubular-like 8 mm in diameter possibly blind-ending structure within the right lower quadrant is suggestive of the appendix with slightly hazy walls but no definite surrounding fat stranding. No appendicolith. Trace nonspecific simple free fluid within the pelvis that may be physiologic. Findings could represent  non-perforated early/mild appendicitis. Correlate clinically. 2. Otherwise no acute intra-abdominal or intrapelvic abnormality. Electronically Signed   By: Tish Frederickson M.D.   On: 05/05/2023 18:21    Procedures Procedures    Medications Ordered in ED Medications  iohexol (OMNIPAQUE) 350 MG/ML injection 75 mL (75 mLs Intravenous Contrast Given 05/05/23 1802)    ED Course/ Medical Decision Making/ A&P                             Medical Decision Making Amount and/or Complexity of Data Reviewed Radiology: ordered.  Risk Prescription drug management.   37 y.o. female presents to the ED for concern of 6 days of epigastric abdominal pain  Differential diagnosis includes but is not limited to peptic ulcer, GERD, constipation, gastroenteritis, cholelithiasis, cholangitis, appendicitis, pancreatitis.  ED Course:  Patient overall well-appearing with normal vital signs.  Mild tenderness to the epigastric region on exam, with no tenderness elsewhere on the abdomen.  Otherwise benign exam.  No leukocytosis, LFTs and lipase  within normal limits.  Urinalysis unremarkable, serum pregnancy negative. Given ongoing pain has not improved with Maalox and famotidine at urgent care, unremarkable labs, will further evaluate with CT abdomen pelvis.  CT abdomen pelvis shows possible haziness around the appendix, no fat stranding.  On repeat examination patient comfortable appearing and upon palpation of the right lower quadrant did not have any pain.  She is not having any fever or chills, no white count, do not suspect appendicitis at this time.  Suspect this is an incidental finding.  Overall unremarkable workup, no concern for cholelithiasis, cholangitis, appendicitis, pancreatitis at this time. Will encourage patient to follow-up with GI.  Will treat for possible gastric ulcer with Protonix and famotidine.   Disposition:  The patient was discharged home with instructions to start 8-week course of  Protonix and continue taking her famotidine received at urgent care.  Instructed her to make an appointment with GI.  Return precautions given.  Lab Tests: I Ordered, and personally interpreted labs.  The pertinent results include:   CBC with no leukocytosis CMP within normal limits, no LFT elevations Urinalysis with no nitrites or leukocytes, no RBCs  Imaging Studies ordered: I ordered imaging studies including CT abdomen pelvis I independently visualized the imaging with scope of interpretation limited to determining acute life threatening conditions related to emergency care. Imaging showed haziness around the appendix, no fat stranding I agree with the radiologist interpretation   Cardiac Monitoring: / EKG: Not indicated  Consultations Obtained: None    Co morbidities that complicate the patient evaluation  None  Social Determinants of Health:  Unknown              Final Clinical Impression(s) / ED Diagnoses Final diagnoses:  Epigastric pain    Rx / DC Orders ED Discharge Orders          Ordered    pantoprazole (PROTONIX) 40 MG tablet  Daily        05/05/23 1932              Arabella Merles, PA-C 05/05/23 1939    Terrilee Files, MD 05/06/23 [phone]

## 2023-05-16 ENCOUNTER — Ambulatory Visit (HOSPITAL_COMMUNITY)
Admission: EM | Admit: 2023-05-16 | Discharge: 2023-05-16 | Disposition: A | Discharge status: Home / Self Care | Payer: Medicaid Other | Attending: Emergency Medicine | Admitting: Emergency Medicine

## 2023-05-16 ENCOUNTER — Encounter (HOSPITAL_COMMUNITY): Payer: Self-pay

## 2023-05-16 DIAGNOSIS — K5900 Constipation, unspecified: Secondary | ICD-10-CM | POA: Diagnosis not present

## 2023-05-16 DIAGNOSIS — K644 Residual hemorrhoidal skin tags: Secondary | ICD-10-CM

## 2023-05-16 MED ORDER — HYDROCORTISONE 1 % EX CREA: TOPICAL_CREAM | CUTANEOUS | 0 refills | Status: DC

## 2023-05-16 MED ORDER — DOCUSATE SODIUM 100 MG PO CAPS: 100.0000 mg | ORAL_CAPSULE | Freq: Two times a day (BID) | ORAL | 0 refills | Status: DC

## 2023-05-16 NOTE — ED Triage Notes (Signed)
Pt c/o a knot around her rectum area since Monday after having a BM. Denies pain or discoloration.

## 2023-05-16 NOTE — ED Provider Notes (Signed)
[facility]    CSN: [id number] Arrival date & time: 05/16/23  0803      History   Chief Complaint Chief Complaint  Patient presents with   Hemorrhoids    HPI Brandi Turner is a 37 y.o. female.   Patient presents to clinic for the the complaint of a painful knot around her rectum that appeared Monday after straining to have a bowel movement.  Denies any blood in the toilet or in stool.  She has been having a hard time passing stool, felt constipated because she is recently changed her diet to bland diet to help with her stomach pain/acid reflux.  She did have a bowel movement yesterday that was normal, no straining and no pain.  Has never been constipated or had hemorrhoids in the past.  Daughter providing language interpretation in room, declined medical language interpreter.   The history is provided by the patient and medical records. The history is limited by a language barrier.    Past Medical History:  Diagnosis Date   Medical history non-contributory    No pertinent past medical history     There are no problems to display for this patient.   Past Surgical History:  Procedure Laterality Date   NO PAST SURGERIES      OB History     Gravida  4   Para  4   Term  4   Preterm  0   AB  0   Living  4      SAB  0   IAB  0   Ectopic  0   Multiple  0   Live Births  4            Home Medications    Prior to Admission medications   Medication Sig Start Date End Date Taking? Authorizing Provider  docusate sodium (COLACE) 100 MG capsule Take 1 capsule (100 mg total) by mouth every 12 (twelve) hours. 05/16/23  Yes Rinaldo Ratel, [place] N, FNP  hydrocortisone cream 1 % Apply to affected area 2 times daily 05/16/23  Yes Rinaldo Ratel, [place] N, FNP  famotidine (PEPCID) 40 MG tablet Take 1 tablet (40 mg total) by mouth daily. 05/03/23   Rodriguez-Southworth, Nettie Elm, PA-C  pantoprazole (PROTONIX) 40 MG tablet Take 1 tablet (40 mg total) by mouth daily.  05/05/23 06/30/23  Arabella Merles, PA-C    Family History History reviewed. No pertinent family history.  Social History Social History   Tobacco Use   Smoking status: Never   Smokeless tobacco: Never  Vaping Use   Vaping status: Never Used  Substance Use Topics   Alcohol use: No   Drug use: No     Allergies   Patient has no known allergies.   Review of Systems Review of Systems  Gastrointestinal:  Positive for constipation and rectal pain. Negative for blood in stool.     Physical Exam Triage Vital Signs ED Triage Vitals [05/16/23 0828]  Encounter Vitals Group     BP 124/83     Systolic BP Percentile      Diastolic BP Percentile      Pulse Rate 65     Resp 18     Temp 98.3 F (36.8 C)     Temp Source Oral     SpO2 100 %     Weight      Height      Head Circumference      Peak Flow      Pain Score  0     Pain Edwina      Pain Education      Exclude from Growth Chart    No data found.  Updated Vital Signs BP 124/83 (BP Location: Right Arm)   Pulse 65   Temp 98.3 F (36.8 C) (Oral)   Resp 18   LMP 05/10/2023 (Approximate)   SpO2 100%   Visual Acuity Right Eye Distance:   Left Eye Distance:   Bilateral Distance:    Right Eye Near:   Left Eye Near:    Bilateral Near:     Physical Exam Vitals and nursing note reviewed. Exam conducted with a chaperone present.  Constitutional:      Appearance: Normal appearance.  HENT:     Head: Normocephalic and atraumatic.     Right Ear: External ear normal.     Left Ear: External ear normal.     Nose: Nose normal.     Mouth/Throat:     Mouth: Mucous membranes are moist.  Eyes:     Conjunctiva/sclera: Conjunctivae normal.  Cardiovascular:     Rate and Rhythm: Normal rate.  Pulmonary:     Effort: Pulmonary effort is normal. No respiratory distress.  Genitourinary:    Rectum: External hemorrhoid present.    Musculoskeletal:        General: Normal range of motion.  Skin:    General: Skin is warm and  dry.  Neurological:     General: No focal deficit present.     Mental Status: She is alert.  Psychiatric:        Mood and Affect: Mood normal.      UC Treatments / Results  Labs (all labs ordered are listed, but only abnormal results are displayed) Labs Reviewed - No data to display  EKG   Radiology No results found.  Procedures Procedures (including critical care time)  Medications Ordered in UC Medications - No data to display  Initial Impression / Assessment and Plan / UC Course  I have reviewed the triage vital signs and the nursing notes.  Pertinent labs & imaging results that were available during my care of the patient were reviewed by me and considered in my medical decision making (see chart for details).  Vitals and triage reviewed, patient is hemodynamically stable.  There is a small, non-thrombosed external hemorrhoid to the rectum that is tender to palpation.  No masses, anal fissures or bleeding.  Discussed increasing fiber intake, stool softeners and topical hemorrhoid relief.  Plan of care, follow-up care and return precautions given, no questions at this time.      Final Clinical Impressions(s) / UC Diagnoses   Final diagnoses:  External hemorrhoid  Constipation, unspecified constipation type     Discharge Instructions      B?n b? tr? ngo?i khi khm s?c kh?e. B?n c th? dng kem bi vo vng ny hai l?n m?i ngy. ?i?u quan tr?ng l b?n ph?i u?ng t nh?t 64 ounce n??c m?i ngy, t?ng l??ng ch?t x? h?p th? (danh sch bn d??i), t?p th? d?c th??ng xuyn v b?n c?ng c th? dng thu?c nhu?n trng ?? gip ng?n ng?a to bn trong t??ng lai, gip ng?n ng?a b?nh tr? bng pht. ?i khi n?u b?n b? tr? bng pht, vi?c c mu ?? t??i trong phn ho?c trong b?n c?u l ?i?u bnh th??ng.  Vui lng tm ki?m s? ch?m [place] ngay l?p t?c n?u b?n b? ?au d? d?i, s?ng tr?, mu ?? s?m trong phn ho?c b?t  k? tri?u ch?ng ?ng lo ng?i m?i no.  [place] - c?i xo?n, rau bina, b?p c?i,  h?n h?p rau ma xun, rau arugula Tri cy - qu? b?, qu? m?ng (vi?t qu?t, mm xi, mm xi ?en), to, cam, l?u, b??i, kiwi Rau - m?ng ty, sp l?, bng c?i xanh, ??u xanh, c?i [place], ?t chung, c? c?i ???ng; trnh xa ho?c h?n ch? cc lo?i rau c nhi?u tinh b?t nh? khoai ty, c r?t, ??u H Lan Cc lo?i th?c ph?m thng th??ng khc - ??u th?n, lng tr?ng tr?ng, h?nh nhn, c ch, h?t h??ng d??ng, h?t b ng, s?a chua khng bo, s?a h?nh nhn, h?t lanh, h?t dim m?ch, y?n m?ch Th?t - T?t h?n l nn ?n th?t n?c v h?n ch? th?t ?? bao g?m th?t l?n xu?ng cn m?t l?n m?t tu?n. C ?nh b?t t? nhin, ?c g l nh?ng l?a ch?n t?t v chng c xu h??ng l ngu?n protein n?c t?t. Tuy nhin, hy l?u  ??n nhn natri trn cc lo?i th?t b?n mua.  You have an external hemorrhoid on your physical exam.  You can use the cream to this area twice daily.  It is important that you are drinking at least 64 ounces of water daily, increasing your fiber intake (list below), getting regular physical activity and you can also take the stool softener to help prevent constipation in the future, to help prevent flaring up your hemorrhoid.  Sometimes if you have a flareup of the hemorrhoid it is not uncommon to have bright red blood in your stool or in the toilet.  Please seek immediate care if you develop extreme pain, swelling of the hemorrhoid, dark red blood in her stool, or any new concerning symptoms.   Salads - kale, spinach, cabbage, spring mix, arugula Fruits - avocadoes, berries (blueberries, raspberries, blackberries), apples, oranges, pomegranate, grapefruit, kiwi Vegetables - asparagus, cauliflower, broccoli, green beans, brussel sprouts, bell peppers, beets; stay away from or limit starchy vegetables like potatoes, carrots, peas Other general foods - kidney beans, egg whites, almonds, walnuts, sunflower seeds, pumpkin seeds, fat free yogurt, almond milk, flax seeds, quinoa, oats  Meat - It is better to eat lean meats  and limit your red meat including pork to once a week.  Wild caught fish, chicken breast are good options as they tend to be leaner sources of good protein. Still be mindful of the sodium labels for the meats you buy.        ED Prescriptions     Medication Sig Dispense Auth. Provider   docusate sodium (COLACE) 100 MG capsule Take 1 capsule (100 mg total) by mouth every 12 (twelve) hours. 60 capsule [place], [place] N, [place]   hydrocortisone cream 1 % Apply to affected area 2 times daily 15 g [employer], [place], [place]      PDMP not reviewed this encounter.   Rinaldo Ratel [place] N, [place] 05/16/23 [id number]

## 2023-05-16 NOTE — Discharge Instructions (Addendum)
B?n b? tr? ngo?i khi khm s?c kh?e. B?n c th? dng kem bi vo vng ny hai l?n m?i ngy. ?i?u quan tr?ng l b?n ph?i u?ng t nh?t 64 ounce n??c m?i ngy, t?ng l??ng ch?t x? h?p th? (danh sch bn d??i), t?p th? d?c th??ng xuyn v b?n c?ng c th? dng thu?c nhu?n trng ?? gip ng?n ng?a to bn trong t??ng lai, gip ng?n ng?a b?nh tr? bng pht. ?i khi n?u b?n b? tr? bng pht, vi?c c mu ?? t??i trong phn ho?c trong b?n c?u l ?i?u bnh th??ng.  Vui lng tm ki?m s? ch?m [place] ngay l?p t?c n?u b?n b? ?au d? d?i, s?ng tr?, mu ?? s?m trong phn ho?c b?t k? tri?u ch?ng ?ng lo ng?i m?i no.  [place] - c?i xo?n, rau bina, b?p c?i, h?n h?p rau ma xun, rau arugula Tri cy - qu? b?, qu? m?ng (vi?t qu?t, mm xi, mm xi ?en), to, cam, l?u, b??i, kiwi Rau - m?ng ty, sp l?, bng c?i xanh, ??u xanh, c?i [place], ?t chung, c? c?i ???ng; trnh xa ho?c h?n ch? cc lo?i rau c nhi?u tinh b?t nh? khoai ty, c r?t, ??u H Lan Cc lo?i th?c ph?m thng th??ng khc - ??u th?n, lng tr?ng tr?ng, h?nh nhn, c ch, h?t h??ng d??ng, h?t b ng, s?a chua khng bo, s?a h?nh nhn, h?t lanh, h?t dim m?ch, y?n m?ch Th?t - T?t h?n l nn ?n th?t n?c v h?n ch? th?t ?? bao g?m th?t l?n xu?ng cn m?t l?n m?t tu?n. C ?nh b?t t? nhin, ?c g l nh?ng l?a ch?n t?t v chng c xu h??ng l ngu?n protein n?c t?t. Tuy nhin, hy l?u  ??n nhn natri trn cc lo?i th?t b?n mua.  You have an external hemorrhoid on your physical exam.  You can use the cream to this area twice daily.  It is important that you are drinking at least 64 ounces of water daily, increasing your fiber intake (list below), getting regular physical activity and you can also take the stool softener to help prevent constipation in the future, to help prevent flaring up your hemorrhoid.  Sometimes if you have a flareup of the hemorrhoid it is not uncommon to have bright red blood in your stool or in the toilet.  Please seek immediate care if you develop  extreme pain, swelling of the hemorrhoid, dark red blood in her stool, or any new concerning symptoms.   Salads - kale, spinach, cabbage, spring mix, arugula Fruits - avocadoes, berries (blueberries, raspberries, blackberries), apples, oranges, pomegranate, grapefruit, kiwi Vegetables - asparagus, cauliflower, broccoli, green beans, brussel sprouts, bell peppers, beets; stay away from or limit starchy vegetables like potatoes, carrots, peas Other general foods - kidney beans, egg whites, almonds, walnuts, sunflower seeds, pumpkin seeds, fat free yogurt, almond milk, flax seeds, quinoa, oats  Meat - It is better to eat lean meats and limit your red meat including pork to once a week.  Wild caught fish, chicken breast are good options as they tend to be leaner sources of good protein. Still be mindful of the sodium labels for the meats you buy.

## 2024-01-28 ENCOUNTER — Ambulatory Visit: Admitting: *Deleted

## 2024-01-28 VITALS — BP 111/74 | HR 83 | Ht 60.75 in | Wt 105.7 lb

## 2024-01-28 DIAGNOSIS — Z3201 Encounter for pregnancy test, result positive: Secondary | ICD-10-CM | POA: Diagnosis not present

## 2024-01-28 DIAGNOSIS — Z3480 Encounter for supervision of other normal pregnancy, unspecified trimester: Secondary | ICD-10-CM

## 2024-01-28 LAB — POCT URINE PREGNANCY: Preg Test, Ur: POSITIVE — AB

## 2024-01-28 MED ORDER — VITAFOL GUMMIES 3.33-0.333-34.8 MG PO CHEW: 3.0000 | CHEWABLE_TABLET | Freq: Every day | ORAL | 11 refills | Status: AC

## 2024-01-28 NOTE — Progress Notes (Signed)
 Ms. Situ presents today for UPT. She has no unusual complaints. LMP: Unknown (late December)    OBJECTIVE: Appears well, in no apparent distress.  OB History     Gravida  4   Para  4   Term  4   Preterm  0   AB  0   Living  4      SAB  0   IAB  0   Ectopic  0   Multiple  0   Live Births  4          Home UPT Result: positive In-Office UPT result: positive I have reviewed the patient's medical, obstetrical, social, and family histories, and medications.   ASSESSMENT: Positive pregnancy test  PLAN Prenatal care to be completed at: [facility] Scheduled for Intake/ [place] on 01/30/24 @ 8:15 am SAB, Ectopic, and extreme N/V precautions and location of MAU reviewed.

## 2024-01-30 ENCOUNTER — Other Ambulatory Visit (INDEPENDENT_AMBULATORY_CARE_PROVIDER_SITE_OTHER): Payer: Self-pay

## 2024-01-30 ENCOUNTER — Ambulatory Visit (INDEPENDENT_AMBULATORY_CARE_PROVIDER_SITE_OTHER): Admitting: *Deleted

## 2024-01-30 VITALS — BP 113/76 | HR 85 | Wt 105.0 lb

## 2024-01-30 DIAGNOSIS — Z1339 Encounter for screening examination for other mental health and behavioral disorders: Secondary | ICD-10-CM

## 2024-01-30 DIAGNOSIS — Z3482 Encounter for supervision of other normal pregnancy, second trimester: Secondary | ICD-10-CM

## 2024-01-30 DIAGNOSIS — O3680X0 Pregnancy with inconclusive fetal viability, not applicable or unspecified: Secondary | ICD-10-CM

## 2024-01-30 DIAGNOSIS — Z3A17 17 weeks gestation of pregnancy: Secondary | ICD-10-CM

## 2024-01-30 DIAGNOSIS — Z3480 Encounter for supervision of other normal pregnancy, unspecified trimester: Secondary | ICD-10-CM

## 2024-01-30 NOTE — Patient Instructions (Signed)

## 2024-01-30 NOTE — Progress Notes (Signed)
 New OB Intake  In Person [place] interpreter used.  I connected with Brandi Turner  on 01/30/24 at  8:15 AM EDT In Person Visit and verified that I am speaking with the correct person using two identifiers. Nurse is located at [facility] and pt is located at [place].  I discussed the limitations, risks, security and privacy concerns of performing an evaluation and management service by telephone and the availability of in person appointments. I also discussed with the patient that there may be a patient responsible charge related to this service. The patient expressed understanding and agreed to proceed.  I explained I am completing New OB Intake today. We discussed EDD of Not found.. Pt is [id number]. I reviewed her allergies, medications and Medical/Surgical/OB history.    There are no active problems to display for this patient.   Concerns addressed today  Delivery Plans Plans to deliver at [facility]. Discussed the nature of our practice with multiple providers including residents and students. Due to the size of the practice, the delivering provider may not be the same as those providing prenatal care.   Patient is not interested in water birth. Offered upcoming OB visit with CNM to discuss further.  MyChart/Babyscripts MyChart access verified. I explained pt will have some visits in office and some virtually. Babyscripts instructions given and order placed. Patient verifies receipt of registration text/e-mail. Account successfully created and app downloaded. If patient is a candidate for Optimized scheduling, add to sticky note.   Blood Pressure Cuff/Weight Scale Blood pressure cuff ordered for patient to pick-up from [employer]. Explained after first prenatal appt pt will check weekly and document in Babyscripts. Patient does not have weight scale; patient may purchase if they desire to track weight weekly in Babyscripts.  Anatomy US Explained first scheduled [place] will be around 19 weeks.  Anatomy US scheduled for TBD at TBD.  Interested in [place]? If yes, send referral and doula dot phrase.   Is patient a candidate for Babyscripts Optimization? No, due to [employer].   First visit review I reviewed new OB appt with patient. Explained pt will be seen by Sharen Counter, CNM at first visit. Discussed Avelina Laine genetic screening with patient. Requests Panor[employer] and [employer].. Routine prenatal labs  collected at today's visit.    Last Pap No results found for: "DIAGPAP"  Harrel Lemon, RN 01/30/2024  8:14 AM

## 2024-02-01 LAB — CBC/D/PLT+RPR+RH+ABO+RUBIGG...
Antibody Screen: NEGATIVE
Basophils Absolute: 0.1 x10E3/uL (ref 0.0–0.2)
Basos: 1 %
EOS (ABSOLUTE): 0.1 x10E3/uL (ref 0.0–0.4)
Eos: 2 %
HCV Ab: NONREACTIVE
HIV Screen 4th Generation wRfx: NONREACTIVE
Hematocrit: 36 % (ref 34.0–46.6)
Hemoglobin: 11.5 g/dL (ref 11.1–15.9)
Hepatitis B Surface Ag: NEGATIVE
Immature Grans (Abs): 0 x10E3/uL (ref 0.0–0.1)
Immature Granulocytes: 0 %
Lymphocytes Absolute: 1.5 x10E3/uL (ref 0.7–3.1)
Lymphs: 22 %
MCH: 26.6 pg (ref 26.6–33.0)
MCHC: 31.9 g/dL (ref 31.5–35.7)
MCV: 83 fL (ref 79–97)
Monocytes Absolute: 0.3 x10E3/uL (ref 0.1–0.9)
Monocytes: 4 %
Neutrophils Absolute: 4.8 x10E3/uL (ref 1.4–7.0)
Neutrophils: 71 %
Platelets: 338 x10E3/uL (ref 150–450)
RBC: 4.33 x10E6/uL (ref 3.77–5.28)
RDW: 12.8 % (ref 11.7–15.4)
RPR Ser Ql: NONREACTIVE
Rh Factor: POSITIVE
Rubella Antibodies, IGG: 3.06 {index}
WBC: 6.8 x10E3/uL (ref 3.4–10.8)

## 2024-02-01 LAB — AFP, SERUM, OPEN SPINA BIFIDA
AFP MoM: 2.23
AFP Value: 126.8 ng/mL
Gest. Age on Collection Date: 18.2 wk
Maternal Age At EDD: 36.2 a
OSBR Risk 1 IN: 492
Test Results:: NEGATIVE
Weight: 105 [lb_av]

## 2024-02-01 LAB — URINE CULTURE, OB REFLEX

## 2024-02-01 LAB — HCV INTERPRETATION

## 2024-02-01 LAB — CULTURE, OB URINE

## 2024-02-05 LAB — PANORAMA PRENATAL TEST FULL PANEL:PANORAMA TEST PLUS 5 ADDITIONAL MICRODELETIONS: FETAL FRACTION: 8.7

## 2024-02-08 LAB — HORIZON CUSTOM: REPORT SUMMARY: POSITIVE — AB

## 2024-02-11 ENCOUNTER — Encounter: Payer: Self-pay | Admitting: Obstetrics and Gynecology

## 2024-02-11 DIAGNOSIS — D563 Thalassemia minor: Secondary | ICD-10-CM | POA: Insufficient documentation

## 2024-02-19 ENCOUNTER — Encounter: Payer: Self-pay | Admitting: Advanced Practice Midwife

## 2024-02-19 ENCOUNTER — Other Ambulatory Visit (HOSPITAL_COMMUNITY)
Admission: RE | Admit: 2024-02-19 | Discharge: 2024-02-19 | Disposition: A | Discharge status: Home / Self Care | Source: Ambulatory Visit | Attending: Advanced Practice Midwife | Admitting: Advanced Practice Midwife

## 2024-02-19 ENCOUNTER — Ambulatory Visit: Admitting: Advanced Practice Midwife

## 2024-02-19 VITALS — BP 110/73 | HR 84 | Wt 110.8 lb

## 2024-02-19 DIAGNOSIS — Z113 Encounter for screening for infections with a predominantly sexual mode of transmission: Secondary | ICD-10-CM | POA: Diagnosis not present

## 2024-02-19 DIAGNOSIS — Z3A19 19 weeks gestation of pregnancy: Secondary | ICD-10-CM

## 2024-02-19 DIAGNOSIS — Z124 Encounter for screening for malignant neoplasm of cervix: Secondary | ICD-10-CM | POA: Insufficient documentation

## 2024-02-19 DIAGNOSIS — O09522 Supervision of elderly multigravida, second trimester: Secondary | ICD-10-CM

## 2024-02-19 DIAGNOSIS — Z603 Acculturation difficulty: Secondary | ICD-10-CM

## 2024-02-19 DIAGNOSIS — Z3480 Encounter for supervision of other normal pregnancy, unspecified trimester: Secondary | ICD-10-CM

## 2024-02-19 DIAGNOSIS — O09529 Supervision of elderly multigravida, unspecified trimester: Secondary | ICD-10-CM | POA: Insufficient documentation

## 2024-02-19 DIAGNOSIS — Z758 Other problems related to medical facilities and other health care: Secondary | ICD-10-CM

## 2024-02-19 NOTE — Progress Notes (Signed)
 Subjective:   Brandi Turner is a 38 y.o. [id number] at [redacted]w[redacted]d by unsure LMP being seen today for her first obstetrical visit.  Her obstetrical history is significant for advanced maternal age and has Supervision of other normal pregnancy, antepartum and Alpha thalassemia silent carrier on their problem list.. Patient does intend to breast feed. Pregnancy history fully reviewed.  Patient reports no complaints.  HISTORY: OB History  Gravida Para Term Preterm AB Living  5 4 4  0 0 4  SAB IAB Ectopic Multiple Live Births  0 0 0 0 4    # Outcome Date GA Lbr Len/2nd Weight Sex Type Anes PTL Lv  5 Current           4 Term 08/20/16 [redacted]w[redacted]d 03:03 / 00:08 6 lb 4.2 oz (2.841 kg) F Vag-Spont None  LIV     Name: Converse,GIRL Nelwyn     Apgar1: 9  Apgar5: 9  3 Term 12/09/13 [redacted]w[redacted]d 04:20 / 00:03 5 lb 13.1 oz (2.64 kg) M Vag-Spont None  LIV     Name: Fayson,BOY Yancy     Apgar1: 7  Apgar5: 9  2 Term 06/06/11 [redacted]w[redacted]d 09:25 / 00:05 5 lb 15.6 oz (2.71 kg) M Vag-Spont None  LIV     Birth Comments: normal, no anomalies noted     Name: Wegner,BOY Chailyn     Apgar1: 9  Apgar5: 9  1 Term 06/02/09    M Vag-Spont   LIV   Past Medical History:  Diagnosis Date   Medical history non-contributory    No pertinent past medical history    Past Surgical History:  Procedure Laterality Date   NO PAST SURGERIES     Family History  Problem Relation Age of Onset   Healthy Mother    Healthy Father    Social History   Tobacco Use   Smoking status: Never   Smokeless tobacco: Never  Vaping Use   Vaping status: Never Used  Substance Use Topics   Alcohol use: No   Drug use: No   No Known Allergies Current Outpatient Medications on File Prior to Visit  Medication Sig Dispense Refill   Prenatal Vit-Fe Phos-FA-Omega (VITAFOL  GUMMIES) 3.33-0.333-34.8 MG CHEW Chew 3 tablets by mouth daily. 90 tablet 11   docusate sodium  (COLACE) 100 MG capsule Take 1 capsule (100 mg total) by mouth every 12 (twelve) hours. (Patient not taking: Reported on  02/19/2024) 60 capsule 0   famotidine  (PEPCID ) 40 MG tablet Take 1 tablet (40 mg total) by mouth daily. (Patient not taking: Reported on 02/19/2024) 14 tablet 0   hydrocortisone  cream 1 % Apply to affected area 2 times daily (Patient not taking: Reported on 02/19/2024) 15 g 0   pantoprazole  (PROTONIX ) 40 MG tablet Take 1 tablet (40 mg total) by mouth daily. 56 tablet 0   No current facility-administered medications on file prior to visit.     Indications for ASA therapy (per uptodate) One of the following: Previous pregnancy with preeclampsia, especially early onset and with an adverse outcome No Multifetal gestation No Chronic hypertension No Type 1 or 2 diabetes mellitus No Chronic kidney disease No Autoimmune disease (antiphospholipid syndrome, systemic lupus erythematosus) No   Two or more of the following: Nulliparity No Obesity (body mass index >30 kg/m2) No Family history of preeclampsia in mother or sister No Age >=35 years Yes Sociodemographic characteristics (African American race, low socioeconomic level) No Personal risk factors (eg, previous pregnancy with low birth weight or small for  gestational age infant, previous adverse pregnancy outcome [eg, stillbirth], interval >10 years between pregnancies) No   Indications for early 1 hour GTT (per uptodate)  BMI >25 (>23 in Asian women) AND one of the following  Gestational diabetes mellitus in a previous pregnancy No Glycated hemoglobin >=5.7 percent (39 mmol/mol), impaired glucose tolerance, or impaired fasting glucose on previous testing No First-degree relative with diabetes No High-risk race/ethnicity (eg, African American, Latino, Native American, Asian American, Pacific Islander) Yes History of cardiovascular disease No Hypertension or on therapy for hypertension No High-density lipoprotein cholesterol level <35 mg/dL (6.04 mmol/L) and/or a triglyceride level >250 mg/dL (5.40 mmol/L) No Polycystic ovary syndrome  No Physical inactivity No Other clinical condition associated with insulin resistance (eg, severe obesity, acanthosis nigricans) No Previous birth of an infant weighing >=4000 g No Previous stillbirth of unknown cause No Exam   Vitals:   02/19/24 1516  BP: 110/73  Pulse: 84  Weight: 110 lb 12.8 oz (50.3 kg)      VS reviewed, nursing note reviewed,  Constitutional: well developed, well nourished, no distress HEENT: normocephalic, thyroid without enlargement or mass HEART: RRR, no murmurs rubs/gallops RESP: clear and equal to auscultation bilaterally in all lobes  Breast Exam: Deferred Neuro: alert and oriented x 3 Skin: warm, dry Psych: affect normal Pelvic exam: Performed: Cervix pink, visually closed, without lesion, scant white creamy discharge, vaginal walls and external genitalia normal    Assessment:   Pregnancy: [id number] Patient Active Problem List   Diagnosis Date Noted   Alpha thalassemia silent carrier 02/11/2024   Supervision of other normal pregnancy, antepartum 01/30/2024     Plan:  1. Supervision of other normal pregnancy, antepartum --Anticipatory guidance about next visits/weeks of pregnancy given.  --FH today c/w estimated LMP of 10/07/23.  Anatomy US  ordered.  2. Encounter for screening for cervical cancer (Primary) --Pap today  3. Screening examination for STI --[facility]  4. Language barrier affecting health care --[place] interpreter used. Pt would like to use her daughter, and discussed signing waiver.  Concerned that waiver would mean interpreter services not available at future visits when her child is not present.    Initial labs reviewed Continue prenatal vitamins. NIPS: results reviewed. Ultrasound discussed; fetal anatomic survey: ordered. Problem list reviewed and updated. The nature of [facility] with multiple MDs and other Advanced Practice Providers was explained to patient; also emphasized that  residents, students are part of our team. Routine obstetric precautions reviewed. Return in about 4 weeks (around 03/18/2024) for LOB.   Arlester Bence, CNM 02/19/24 6:09 PM

## 2024-02-19 NOTE — Progress Notes (Signed)
 Pt presents for NOB visit. No concerns.

## 2024-02-20 LAB — CERVICOVAGINAL ANCILLARY ONLY
Chlamydia: NEGATIVE
Comment: NEGATIVE
Comment: NEGATIVE
Comment: NORMAL
Neisseria Gonorrhea: NEGATIVE
Trichomonas: NEGATIVE

## 2024-02-21 LAB — CYTOLOGY - PAP
Comment: NEGATIVE
Diagnosis: NEGATIVE
High risk HPV: NEGATIVE

## 2024-03-18 ENCOUNTER — Ambulatory Visit

## 2024-03-19 ENCOUNTER — Encounter: Payer: Self-pay | Admitting: Advanced Practice Midwife

## 2024-03-19 ENCOUNTER — Ambulatory Visit: Payer: Self-pay | Admitting: Advanced Practice Midwife

## 2024-03-19 VITALS — BP 114/74 | HR 84 | Wt 116.8 lb

## 2024-03-19 DIAGNOSIS — Z603 Acculturation difficulty: Secondary | ICD-10-CM | POA: Diagnosis not present

## 2024-03-19 DIAGNOSIS — Z3480 Encounter for supervision of other normal pregnancy, unspecified trimester: Secondary | ICD-10-CM | POA: Diagnosis not present

## 2024-03-19 DIAGNOSIS — Z3A23 23 weeks gestation of pregnancy: Secondary | ICD-10-CM

## 2024-03-19 DIAGNOSIS — Z758 Other problems related to medical facilities and other health care: Secondary | ICD-10-CM

## 2024-03-19 NOTE — Progress Notes (Signed)
   PRENATAL VISIT NOTE  Subjective:  Brandi Turner is a 38 y.o. [id number] at [redacted]w[redacted]d being seen today for ongoing prenatal care.  She is currently monitored for the following issues for this low-risk pregnancy and has Supervision of other normal pregnancy, antepartum; Alpha thalassemia silent carrier; and Antepartum multigravida of advanced maternal age on their problem list.  Patient reports no complaints.  Contractions: Not present. Vag. Bleeding: None.  Movement: Present. Denies leaking of fluid.   The following portions of the patient's history were reviewed and updated as appropriate: allergies, current medications, past family history, past medical history, past social history, past surgical history and problem list.   Objective:    Vitals:   03/19/24 0818  BP: 114/74  Pulse: 84  Weight: 116 lb 12.8 oz (53 kg)    Fetal Status:  Fetal Heart Rate (bpm): 151 Fundal Height: 23 cm Movement: Present    General: Alert, oriented and cooperative. Patient is in no acute distress.  Skin: Skin is warm and dry. No rash noted.   Cardiovascular: Normal heart rate noted  Respiratory: Normal respiratory effort, no problems with respiration noted  Abdomen: Soft, gravid, appropriate for gestational age.  Pain/Pressure: Absent     Pelvic: Cervical exam deferred        Extremities: Normal range of motion.  Edema: None  Mental Status: Normal mood and affect. Normal behavior. Normal judgment and thought content.   Assessment and Plan:  Pregnancy: G5P4004 at [redacted]w[redacted]d 1. Supervision of other normal pregnancy, antepartum (Primary) --Anticipatory guidance about next visits/weeks of pregnancy given.   2. Language barrier affecting health care --Pt family member used for communication at her request. Pt speaks dialect and [place] is more difficult to understand.  Pt did not sign waiver in case she needs [place] interpreter at hospital or when family cannot be present.  3. [redacted] weeks gestation of  pregnancy   Preterm labor symptoms and general obstetric precautions including but not limited to vaginal bleeding, contractions, leaking of fluid and fetal movement were reviewed in detail with the patient. Please refer to After Visit Summary for other counseling recommendations.   Return in about 4 weeks (around 04/16/2024) for LOB.  Future Appointments  Date Time Provider Department Center  04/21/2024  8:00 AM [facility] None  04/21/2024 10:15 AM Gabrielle Joiner, MD [facility] None  04/29/2024  8:00 AM [facility] PROVIDER 1 [facility]  04/29/2024  8:30 AM [facility] US1 [facility]    Arlester Bence, CNM

## 2024-03-19 NOTE — Progress Notes (Signed)
 Pt presents for ROB visit. No concerns

## 2024-04-18 ENCOUNTER — Other Ambulatory Visit

## 2024-04-18 ENCOUNTER — Encounter: Payer: Self-pay | Admitting: Physician Assistant

## 2024-04-21 ENCOUNTER — Ambulatory Visit (INDEPENDENT_AMBULATORY_CARE_PROVIDER_SITE_OTHER): Payer: Self-pay | Admitting: Obstetrics

## 2024-04-21 ENCOUNTER — Other Ambulatory Visit

## 2024-04-21 ENCOUNTER — Encounter: Payer: Self-pay | Admitting: Obstetrics

## 2024-04-21 VITALS — BP 116/80 | HR 87 | Wt 123.2 lb

## 2024-04-21 DIAGNOSIS — O0990 Supervision of high risk pregnancy, unspecified, unspecified trimester: Secondary | ICD-10-CM | POA: Diagnosis not present

## 2024-04-21 DIAGNOSIS — Z603 Acculturation difficulty: Secondary | ICD-10-CM

## 2024-04-21 DIAGNOSIS — O09529 Supervision of elderly multigravida, unspecified trimester: Secondary | ICD-10-CM

## 2024-04-21 DIAGNOSIS — D563 Thalassemia minor: Secondary | ICD-10-CM

## 2024-04-21 DIAGNOSIS — Z758 Other problems related to medical facilities and other health care: Secondary | ICD-10-CM

## 2024-04-21 NOTE — Progress Notes (Signed)
 Pt presents for ROB. Interpreter used. No questions or concerns today. VIS given for TDap.

## 2024-04-21 NOTE — Progress Notes (Signed)
 Subjective:  Brandi Turner is a 38 y.o. [id number] at [redacted]w[redacted]d being seen today for ongoing prenatal care.  She is currently monitored for the following issues for this high-risk pregnancy and has Supervision of other normal pregnancy, antepartum; Alpha thalassemia silent carrier; and Antepartum multigravida of advanced maternal age on their problem list.  Patient reports no complaints.   .  .   . Denies leaking of fluid.   The following portions of the patient's history were reviewed and updated as appropriate: allergies, current medications, past family history, past medical history, past social history, past surgical history and problem list. Problem list updated.  Objective:  There were no vitals filed for this visit.  Fetal Status:           General:  Alert, oriented and cooperative. Patient is in no acute distress.  Skin: Skin is warm and dry. No rash noted.   Cardiovascular: Normal heart rate noted  Respiratory: Normal respiratory effort, no problems with respiration noted  Abdomen: Soft, gravid, appropriate for gestational age.       Pelvic:  Cervical exam deferred        Extremities: Normal range of motion.     Mental Status: Normal mood and affect. Normal behavior. Normal judgment and thought content.   Urinalysis:      Assessment and Plan:  Pregnancy: G5P4004 at [redacted]w[redacted]d  1. Supervision of other normal pregnancy, antepartum (Primary) Rx: - CBC - HIV Antibody (routine testing w rflx) - RPR - Glucose Tolerance, 2 Hours w/1 Hour - Hepatitis C antibody  Preterm labor symptoms and general obstetric precautions including but not limited to vaginal bleeding, contractions, leaking of fluid and fetal movement were reviewed in detail with the patient. Please refer to After Visit Summary for other counseling recommendations.   Return in about 2 weeks (around 05/05/2024) for [facility].   Rudy Carlin LABOR, MD 04/21/2024

## 2024-04-22 ENCOUNTER — Ambulatory Visit: Payer: Self-pay | Admitting: Family Medicine

## 2024-04-22 DIAGNOSIS — Z3480 Encounter for supervision of other normal pregnancy, unspecified trimester: Secondary | ICD-10-CM

## 2024-04-22 LAB — CBC
Hematocrit: 37.1 % (ref 34.0–46.6)
Hemoglobin: 11.5 g/dL (ref 11.1–15.9)
MCH: 26.7 pg (ref 26.6–33.0)
MCHC: 31 g/dL — ABNORMAL LOW (ref 31.5–35.7)
MCV: 86 fL (ref 79–97)
Platelets: 329 10*3/uL (ref 150–450)
RBC: 4.31 x10E6/uL (ref 3.77–5.28)
RDW: 12.5 % (ref 11.7–15.4)
WBC: 6.9 10*3/uL (ref 3.4–10.8)

## 2024-04-22 LAB — RPR: RPR Ser Ql: NONREACTIVE

## 2024-04-22 LAB — HIV ANTIBODY (ROUTINE TESTING W REFLEX): HIV Screen 4th Generation wRfx: NONREACTIVE

## 2024-04-22 LAB — GLUCOSE TOLERANCE, 2 HOURS W/ 1HR
Glucose, 1 hour: 118 mg/dL (ref 70–179)
Glucose, 2 hour: 116 mg/dL (ref 70–152)
Glucose, Fasting: 77 mg/dL (ref 70–91)

## 2024-04-29 ENCOUNTER — Ambulatory Visit: Attending: Obstetrics and Gynecology

## 2024-04-29 ENCOUNTER — Other Ambulatory Visit: Payer: Self-pay | Admitting: *Deleted

## 2024-04-29 ENCOUNTER — Ambulatory Visit (HOSPITAL_BASED_OUTPATIENT_CLINIC_OR_DEPARTMENT_OTHER): Admitting: Obstetrics and Gynecology

## 2024-04-29 VITALS — BP 113/77 | HR 87

## 2024-04-29 DIAGNOSIS — O99013 Anemia complicating pregnancy, third trimester: Secondary | ICD-10-CM | POA: Diagnosis not present

## 2024-04-29 DIAGNOSIS — Z3480 Encounter for supervision of other normal pregnancy, unspecified trimester: Secondary | ICD-10-CM

## 2024-04-29 DIAGNOSIS — D563 Thalassemia minor: Secondary | ICD-10-CM | POA: Diagnosis not present

## 2024-04-29 DIAGNOSIS — Z3689 Encounter for other specified antenatal screening: Secondary | ICD-10-CM | POA: Diagnosis not present

## 2024-04-29 DIAGNOSIS — Z3A31 31 weeks gestation of pregnancy: Secondary | ICD-10-CM

## 2024-04-29 DIAGNOSIS — Z3493 Encounter for supervision of normal pregnancy, unspecified, third trimester: Secondary | ICD-10-CM

## 2024-04-29 DIAGNOSIS — Z3687 Encounter for antenatal screening for uncertain dates: Secondary | ICD-10-CM | POA: Diagnosis not present

## 2024-04-29 DIAGNOSIS — Z3A29 29 weeks gestation of pregnancy: Secondary | ICD-10-CM | POA: Insufficient documentation

## 2024-04-29 DIAGNOSIS — O358XX0 Maternal care for other (suspected) fetal abnormality and damage, not applicable or unspecified: Secondary | ICD-10-CM

## 2024-04-29 DIAGNOSIS — O09523 Supervision of elderly multigravida, third trimester: Secondary | ICD-10-CM | POA: Diagnosis not present

## 2024-04-29 DIAGNOSIS — O0933 Supervision of pregnancy with insufficient antenatal care, third trimester: Secondary | ICD-10-CM | POA: Insufficient documentation

## 2024-04-29 DIAGNOSIS — Z363 Encounter for antenatal screening for malformations: Secondary | ICD-10-CM | POA: Insufficient documentation

## 2024-04-29 DIAGNOSIS — Z148 Genetic carrier of other disease: Secondary | ICD-10-CM | POA: Insufficient documentation

## 2024-04-29 NOTE — Progress Notes (Signed)
 Maternal-Fetal Medicine Consultation Name: Julieth Tugman FMW:[id number]  G5 P4004 at 31w 1d gestation.  Patient is here for fetal anatomy scan.  She is unsure of her LMP date.  She was accompanied by her sister-in-law and the patient preferred her sister-in-law to be the interpreter.  Advanced maternal age.  On cell free fetal DNA screening, the risks of fetal aneuploidies are not increased.  Obstetric history significant for 4 term vaginal deliveries.  Ultrasound Fetal biometry is consistent with 31 weeks and 1 day gestation.  Amniotic fluid is normal good fetal activity seen.  Fetal anatomical survey appears normal but limited because of advanced gestational age.  Silent carrier for alpha thalassemia I discussed carrier screening that show she is a silent carrier for alpha thalassemia (aa/a-).  All her children are from the same partner.  Patient is not aware of her carrier state.  I briefly discussed the genetics and recommended genetic counseling. I discussed partners screening and encouraged her partner to accompany her during genetic counseling.  Advanced maternal age I discussed the significance and limitations of cell free fetal DNA screening that has a greater detection rate for Down syndrome  Recommendations - An appointment was made for her to return in 4 weeks for fetal growth assessment and completion of fetal anatomy if feasible. - Genetic counseling.  Consultation including face-to-face (more than 50%) counseling 15 minutes.

## 2024-05-05 ENCOUNTER — Ambulatory Visit: Payer: Self-pay | Admitting: Obstetrics

## 2024-05-05 VITALS — BP 123/81 | HR 96 | Wt 124.0 lb

## 2024-05-05 DIAGNOSIS — Z23 Encounter for immunization: Secondary | ICD-10-CM | POA: Diagnosis not present

## 2024-05-05 DIAGNOSIS — Z758 Other problems related to medical facilities and other health care: Secondary | ICD-10-CM

## 2024-05-05 DIAGNOSIS — O09529 Supervision of elderly multigravida, unspecified trimester: Secondary | ICD-10-CM

## 2024-05-05 DIAGNOSIS — Z603 Acculturation difficulty: Secondary | ICD-10-CM | POA: Diagnosis not present

## 2024-05-05 DIAGNOSIS — O0990 Supervision of high risk pregnancy, unspecified, unspecified trimester: Secondary | ICD-10-CM | POA: Diagnosis not present

## 2024-05-05 NOTE — Progress Notes (Signed)
 Pt reports fetal movement, denies pain. Pt desires tdap vaccine today

## 2024-05-05 NOTE — Progress Notes (Signed)
 Subjective:  Brandi Turner is a 38 y.o. H4E5995 at [redacted]w[redacted]d being seen today for ongoing prenatal care.  She is currently monitored for the following issues for this high-risk pregnancy and has Supervision of other normal pregnancy, antepartum; Alpha thalassemia silent carrier; and Antepartum multigravida of advanced maternal age on their problem list.  Patient reports no complaints.  Contractions: Not present. Vag. Bleeding: None.  Movement: Absent. Denies leaking of fluid.   The following portions of the patient's history were reviewed and updated as appropriate: allergies, current medications, past family history, past medical history, past social history, past surgical history and problem list. Problem list updated.  Objective:   Vitals:   05/05/24 0950  BP: 123/81  Pulse: 96  Weight: 124 lb (56.2 kg)    Fetal Status: Fetal Heart Rate (bpm): 143   Movement: Absent     General:  Alert, oriented and cooperative. Patient is in no acute distress.  Skin: Skin is warm and dry. No rash noted.   Cardiovascular: Normal heart rate noted  Respiratory: Normal respiratory effort, no problems with respiration noted  Abdomen: Soft, gravid, appropriate for gestational age.       Pelvic:  Cervical exam deferred        Extremities: Normal range of motion.  Edema: None  Mental Status: Normal mood and affect. Normal behavior. Normal judgment and thought content.   Urinalysis:      Assessment and Plan:  Pregnancy: G5P4004 at [redacted]w[redacted]d  1. Supervision of high risk pregnancy, antepartum (Primary)  2. Antepartum multigravida of advanced maternal age  44. Language barrier affecting health care - interpreter present    Preterm labor symptoms and general obstetric precautions including but not limited to vaginal bleeding, contractions, leaking of fluid and fetal movement were reviewed in detail with the patient. Please refer to After Visit Summary for other counseling recommendations.   Return in about 2 weeks  (around 05/19/2024) for [facility].   Rudy Carlin LABOR, MD 7/`01/2024

## 2024-05-06 ENCOUNTER — Encounter

## 2024-05-17 NOTE — Progress Notes (Unsigned)
   PRENATAL VISIT NOTE  Subjective:  Brandi Turner is a 38 y.o. H4E5995 at [redacted]w[redacted]d being seen today for ongoing prenatal care.  She is currently monitored for the following issues for this low-risk pregnancy and has Supervision of other normal pregnancy, antepartum; Alpha thalassemia silent carrier; and Antepartum multigravida of advanced maternal age on their problem list.  Patient reports no complaints.  Contractions: Irritability. Vag. Bleeding: None.  Movement: Increased. Denies leaking of fluid.   The following portions of the patient's history were reviewed and updated as appropriate: allergies, current medications, past family history, past medical history, past social history, past surgical history and problem list.   Objective:    Vitals:   05/19/24 0834  BP: 115/81  Pulse: 81  Weight: 126 lb 9.6 oz (57.4 kg)    Fetal Status:  Fetal Heart Rate (bpm): 139 Fundal Height: 33 cm Movement: Increased    General: Alert, oriented and cooperative. Patient is in no acute distress.  Skin: Skin is warm and dry. No rash noted.   Cardiovascular: Normal heart rate noted  Respiratory: Normal respiratory effort, no problems with respiration noted  Abdomen: Soft, gravid, appropriate for gestational age.  Pain/Pressure: Present     Pelvic: Cervical exam deferred        Extremities: Normal range of motion.  Edema: None  Mental Status: Normal mood and affect. Normal behavior. Normal judgment and thought content.   Assessment and Plan:  Pregnancy: G5P4004 at [redacted]w[redacted]d  1. Supervision of other normal pregnancy, antepartum (Primary) Patient doing well, feeling regular fetal movement  BP, FHR, FH appropriate  2. [redacted] weeks gestation of pregnancy Anticipatory guidance about next visits/weeks of pregnancy given.  Discussed 36w swabs F/u anatomy scan 05/27/24  3. Multigravida of advanced maternal age in third trimester  Preterm labor symptoms and general obstetric precautions including but not limited to  vaginal bleeding, contractions, leaking of fluid and fetal movement were reviewed in detail with the patient.  Please refer to After Visit Summary for other counseling recommendations.   No follow-ups on file.  Future Appointments  Date Time Provider Department Center  05/27/2024  7:15 AM [facility] PROVIDER 1 [facility]  05/27/2024  7:30 AM [facility] US3 [facility]    Jorene FORBES Moats, PA-C

## 2024-05-19 ENCOUNTER — Ambulatory Visit: Admitting: Physician Assistant

## 2024-05-19 ENCOUNTER — Encounter: Payer: Self-pay | Admitting: Physician Assistant

## 2024-05-19 VITALS — BP 115/81 | HR 81 | Wt 126.6 lb

## 2024-05-19 DIAGNOSIS — Z3480 Encounter for supervision of other normal pregnancy, unspecified trimester: Secondary | ICD-10-CM

## 2024-05-19 DIAGNOSIS — O09523 Supervision of elderly multigravida, third trimester: Secondary | ICD-10-CM

## 2024-05-19 DIAGNOSIS — Z3A34 34 weeks gestation of pregnancy: Secondary | ICD-10-CM | POA: Diagnosis not present

## 2024-05-19 NOTE — Progress Notes (Signed)
 Pt states everything is going well. No concerns today.

## 2024-05-27 ENCOUNTER — Other Ambulatory Visit: Payer: Self-pay | Admitting: *Deleted

## 2024-05-27 ENCOUNTER — Ambulatory Visit (HOSPITAL_BASED_OUTPATIENT_CLINIC_OR_DEPARTMENT_OTHER)

## 2024-05-27 ENCOUNTER — Other Ambulatory Visit: Payer: Self-pay | Admitting: Obstetrics and Gynecology

## 2024-05-27 ENCOUNTER — Ambulatory Visit (HOSPITAL_BASED_OUTPATIENT_CLINIC_OR_DEPARTMENT_OTHER): Attending: Obstetrics and Gynecology | Admitting: Obstetrics and Gynecology

## 2024-05-27 VITALS — BP 126/80

## 2024-05-27 DIAGNOSIS — O0933 Supervision of pregnancy with insufficient antenatal care, third trimester: Secondary | ICD-10-CM

## 2024-05-27 DIAGNOSIS — O09523 Supervision of elderly multigravida, third trimester: Secondary | ICD-10-CM

## 2024-05-27 DIAGNOSIS — Z363 Encounter for antenatal screening for malformations: Secondary | ICD-10-CM | POA: Diagnosis not present

## 2024-05-27 DIAGNOSIS — O365931 Maternal care for other known or suspected poor fetal growth, third trimester, fetus 1: Secondary | ICD-10-CM | POA: Diagnosis present

## 2024-05-27 DIAGNOSIS — Z3687 Encounter for antenatal screening for uncertain dates: Secondary | ICD-10-CM | POA: Insufficient documentation

## 2024-05-27 DIAGNOSIS — Z3A35 35 weeks gestation of pregnancy: Secondary | ICD-10-CM | POA: Diagnosis not present

## 2024-05-27 DIAGNOSIS — D563 Thalassemia minor: Secondary | ICD-10-CM

## 2024-05-27 DIAGNOSIS — O358XX0 Maternal care for other (suspected) fetal abnormality and damage, not applicable or unspecified: Secondary | ICD-10-CM

## 2024-05-27 DIAGNOSIS — Z148 Genetic carrier of other disease: Secondary | ICD-10-CM | POA: Diagnosis not present

## 2024-05-27 DIAGNOSIS — Z3493 Encounter for supervision of normal pregnancy, unspecified, third trimester: Secondary | ICD-10-CM

## 2024-05-27 DIAGNOSIS — Z3480 Encounter for supervision of other normal pregnancy, unspecified trimester: Secondary | ICD-10-CM

## 2024-05-27 DIAGNOSIS — O365930 Maternal care for other known or suspected poor fetal growth, third trimester, not applicable or unspecified: Secondary | ICD-10-CM

## 2024-05-27 DIAGNOSIS — O99013 Anemia complicating pregnancy, third trimester: Secondary | ICD-10-CM | POA: Diagnosis not present

## 2024-05-27 NOTE — Progress Notes (Signed)
 Maternal-Fetal Medicine Consultation Name: Dawson Hollman MRN: [id number]  G5 P4004 at 35w 1d gestation.  Patient is here for fetal growth assessment and completion of fetal anatomy. Blood pressure today at our office is 126/80 mmHg. Obstetrical history significant for 4 term vaginal deliveries.  All her children weighed between 5 and 6 pounds at birth.  Ultrasound On today's ultrasound, the estimated fetal weight is at the 3rd percentile and the abdominal circumference measurement at the 2nd percentile.  Amniotic fluid is normal good fetal activity seen.  Cephalic presentation.  Umbilical artery Doppler showed normal forward diastolic flow.  Antenatal testing is reassuring.  BPP 8/8.  Fetal growth restriction I explained the finding of severe fetal growth restriction that is difficult to differentiate from a constitutionally small for gestational age fetus. Most fetuses are small but not growth restricted. Her pregnancy is dated by third-trimester ultrasound, which is suboptimal.  I discussed the possible causes of fetal growth restriction including placental insufficiency (most common cause), chromosomal anomalies and fetal infections.  Patient did not give history of fever or rashes.  I discussed ultrasound protocol of monitoring fetal growth restriction. Timing of delivery: Severe fetal growth restriction is associated with increased risk of perinatal mortality. I recommended delivery at 37 weeks' gestation. Patient would like to delay her delivery to 39 weeks because of her history of previous small babies at birth.  I counseled the patient with the help of language interpreter present in the room.  Recommendations - Weekly BPP, NST and UA Doppler studies till delivery. - Delivery at [redacted] weeks gestation provide and antenatal testing remains reassuring.   Consultation including face-to-face (more than 50%) counseling 30 minutes.

## 2024-05-27 NOTE — Progress Notes (Addendum)
 Maternal-Fetal Medicine Consultation Name: Brandi Turner MRN: [id number]  G5 P4004 at 35w 1d gestation.  Patient is here for fetal growth assessment and completion of fetal anatomy. Blood pressure today at our office is 126/80 mmHg.  Obstetrical history significant for 4 term vaginal deliveries.  All her children weighed between 5 and 6 pounds at birth.  Ultrasound On today's ultrasound, the estimated fetal weight is at the 3rd percentile and the abdominal circumference measurement at the 2nd percentile.  Amniotic fluid is normal good fetal activity seen.  Cephalic presentation.  Umbilical artery Doppler showed normal forward diastolic flow.  Antenatal testing is reassuring.  BPP 8/8.  Fetal growth restriction I explained the finding of severe fetal growth restriction that is difficult to differentiate from a constitutionally small for gestational age fetus. Most fetuses are small but not growth restricted. Her pregnancy is dated by third-trimester ultrasound, which is suboptimal. Patient is 5 feet tall.  I discussed the possible causes of fetal growth restriction including placental insufficiency (most common cause), chromosomal anomalies and fetal infections.  Patient did not give history of fever or rashes.  I discussed ultrasound protocol of monitoring fetal growth restriction. Timing of delivery: Severe fetal growth restriction is associated with increased risk of perinatal mortality. I recommended delivery at 37 weeks' gestation. Patient would like to delay her delivery to 39 weeks because of her history of previous small babies at birth.  I counseled the patient with the help of language interpreter present in the room.  Recommendations - Weekly BPP, NST and UA Doppler studies till delivery. - Delivery at [redacted] weeks gestation provided antenatal testing remains reassuring.   Consultation including face-to-face (more than 50%) counseling 30 minutes.

## 2024-06-02 ENCOUNTER — Other Ambulatory Visit (HOSPITAL_COMMUNITY)
Admission: RE | Admit: 2024-06-02 | Discharge: 2024-06-02 | Disposition: A | Discharge status: Home / Self Care | Source: Ambulatory Visit | Attending: Obstetrics and Gynecology | Admitting: Obstetrics and Gynecology

## 2024-06-02 ENCOUNTER — Ambulatory Visit: Admitting: Obstetrics and Gynecology

## 2024-06-02 VITALS — BP 114/78 | HR 80 | Wt 129.0 lb

## 2024-06-02 DIAGNOSIS — O365990 Maternal care for other known or suspected poor fetal growth, unspecified trimester, not applicable or unspecified: Secondary | ICD-10-CM | POA: Diagnosis not present

## 2024-06-02 DIAGNOSIS — O09523 Supervision of elderly multigravida, third trimester: Secondary | ICD-10-CM | POA: Diagnosis not present

## 2024-06-02 DIAGNOSIS — Z3A36 36 weeks gestation of pregnancy: Secondary | ICD-10-CM | POA: Insufficient documentation

## 2024-06-02 DIAGNOSIS — D563 Thalassemia minor: Secondary | ICD-10-CM | POA: Diagnosis not present

## 2024-06-02 DIAGNOSIS — O09529 Supervision of elderly multigravida, unspecified trimester: Secondary | ICD-10-CM

## 2024-06-02 DIAGNOSIS — Z3480 Encounter for supervision of other normal pregnancy, unspecified trimester: Secondary | ICD-10-CM

## 2024-06-02 NOTE — Progress Notes (Signed)
   PRENATAL VISIT NOTE  Subjective:  Brandi Turner is a 38 y.o. H4E5995 at 108w0d being seen today for ongoing prenatal care.  She is currently monitored for the following issues for this high-risk pregnancy and has Supervision of other normal pregnancy, antepartum; Alpha thalassemia silent carrier; Antepartum multigravida of advanced maternal age; and Fetal growth restriction antepartum on their problem list.  Patient doing well with no acute concerns today. She reports no complaints.  Contractions: Not present. Vag. Bleeding: None.  Movement: Present. Denies leaking of fluid.   The following portions of the patient's history were reviewed and updated as appropriate: allergies, current medications, past family history, past medical history, past social history, past surgical history and problem list. Problem list updated.  Objective:   Vitals:   06/02/24 0833  BP: 114/78  Pulse: 80  Weight: 129 lb (58.5 kg)    Fetal Status: Fetal Heart Rate (bpm): 139 Fundal Height: 36 cm Movement: Present     General:  Alert, oriented and cooperative. Patient is in no acute distress.  Skin: Skin is warm and dry. No rash noted.   Cardiovascular: Normal heart rate noted  Respiratory: Normal respiratory effort, no problems with respiration noted  Abdomen: Soft, gravid, appropriate for gestational age.  Pain/Pressure: Present     Pelvic: Cervical exam deferred        Extremities: Normal range of motion.  Edema: None  Mental Status:  Normal mood and affect. Normal behavior. Normal judgment and thought content.   Assessment and Plan:  Pregnancy: G5P4004 at [redacted]w[redacted]d  1. Supervision of other normal pregnancy, antepartum (Primary) Continue routine prenatal care  - Culture, beta strep (group b only) - Cervicovaginal ancillary only( [place])  2. [redacted] weeks gestation of pregnancy  - Culture, beta strep (group b only) - Cervicovaginal ancillary only( [place])  3. Antepartum multigravida of advanced  maternal age   76. Alpha thalassemia silent carrier   5. Fetal growth restriction antepartum EFW 3.2% MFM has spoken to patient regarding delivery at 37 weeks, but she believes the baby is small due to her small stature Continue weekly testing, BPP and dopplers IOL at 39 weeks scheduled  Preterm labor symptoms and general obstetric precautions including but not limited to vaginal bleeding, contractions, leaking of fluid and fetal movement were reviewed in detail with the patient.  Please refer to After Visit Summary for other counseling recommendations.   Return in about 1 week (around 06/09/2024) for in person, [facility].   Jerilynn Buddle, MD Faculty Attending Center for [facility]

## 2024-06-02 NOTE — Progress Notes (Signed)
ROB GBS 

## 2024-06-03 LAB — CERVICOVAGINAL ANCILLARY ONLY
Bacterial Vaginitis (gardnerella): NEGATIVE
Candida Glabrata: NEGATIVE
Candida Vaginitis: NEGATIVE
Chlamydia: NEGATIVE
Comment: NEGATIVE
Comment: NEGATIVE
Comment: NEGATIVE
Comment: NEGATIVE
Comment: NEGATIVE
Comment: NORMAL
Neisseria Gonorrhea: NEGATIVE
Trichomonas: NEGATIVE

## 2024-06-04 ENCOUNTER — Ambulatory Visit

## 2024-06-04 ENCOUNTER — Other Ambulatory Visit

## 2024-06-04 ENCOUNTER — Ambulatory Visit: Payer: Self-pay | Admitting: Obstetrics and Gynecology

## 2024-06-04 DIAGNOSIS — Z3480 Encounter for supervision of other normal pregnancy, unspecified trimester: Secondary | ICD-10-CM

## 2024-06-06 LAB — CULTURE, BETA STREP (GROUP B ONLY): Strep Gp B Culture: NEGATIVE

## 2024-06-11 ENCOUNTER — Encounter: Payer: Self-pay | Admitting: Obstetrics

## 2024-06-11 ENCOUNTER — Ambulatory Visit: Admitting: Obstetrics

## 2024-06-11 ENCOUNTER — Ambulatory Visit

## 2024-06-11 ENCOUNTER — Other Ambulatory Visit

## 2024-06-11 VITALS — BP 128/85 | HR 82 | Wt 132.3 lb

## 2024-06-11 DIAGNOSIS — O365990 Maternal care for other known or suspected poor fetal growth, unspecified trimester, not applicable or unspecified: Secondary | ICD-10-CM

## 2024-06-11 DIAGNOSIS — D563 Thalassemia minor: Secondary | ICD-10-CM

## 2024-06-11 DIAGNOSIS — O09529 Supervision of elderly multigravida, unspecified trimester: Secondary | ICD-10-CM | POA: Diagnosis not present

## 2024-06-11 DIAGNOSIS — Z3480 Encounter for supervision of other normal pregnancy, unspecified trimester: Secondary | ICD-10-CM

## 2024-06-11 DIAGNOSIS — Z3A37 37 weeks gestation of pregnancy: Secondary | ICD-10-CM | POA: Diagnosis not present

## 2024-06-11 NOTE — Progress Notes (Signed)
 Pt presents for rob. Pt has no questions or concerns at this time.

## 2024-06-11 NOTE — Progress Notes (Signed)
 Subjective:  Brandi Turner is a 38 y.o. H4E5995 at [redacted]w[redacted]d being seen today for ongoing prenatal care.  She is currently monitored for the following issues for this high-risk pregnancy and has Supervision of other normal pregnancy, antepartum; Alpha thalassemia silent carrier; Antepartum multigravida of advanced maternal age; and Fetal growth restriction antepartum on their problem list.  Patient reports heartburn.  Contractions: Irritability. Vag. Bleeding: None.  Movement: Present. Denies leaking of fluid.   The following portions of the patient's history were reviewed and updated as appropriate: allergies, current medications, past family history, past medical history, past social history, past surgical history and problem list. Problem list updated.  Objective:   Vitals:   06/11/24 0836 06/11/24 0842  BP: (!) 141/93 128/85  Pulse: 72 82  Weight: 132 lb 4.8 oz (60 kg)     Fetal Status: Fetal Heart Rate (bpm): 146   Movement: Present     General:  Alert, oriented and cooperative. Patient is in no acute distress.  Skin: Skin is warm and dry. No rash noted.   Cardiovascular: Normal heart rate noted  Respiratory: Normal respiratory effort, no problems with respiration noted  Abdomen: Soft, gravid, appropriate for gestational age. Pain/Pressure: Present     Pelvic:  Cervical exam deferred        Extremities: Normal range of motion.  Edema: None  Mental Status: Normal mood and affect. Normal behavior. Normal judgment and thought content.   Urinalysis:      ULTRASOUND:   US  MFM OB FOLLOW UP (Accession [id number]) (Order [id number]) Imaging Date: 05/27/2024 Department: [facility] Released By: Lissa Bers D Authorizing: Arna Ranks, MD   Exam Status  Status  Final [99]   PACS Intelerad Image Link   Show images for US  MFM OB FOLLOW UP Related Results          US  MFM UA CORD DOPPLER Final result 05/27/2024        ----------------------------------------------------------------------  OBSTETRICS REPORT                       (Signed Final 05/27/2024 08:53 am) ---------------------------------------------------------------------- Patient Info   ID #:       [id number]                          D.O.B.:  Feb 12, 1986 (38 yrs)(F)  Name:       Brandi Turner                          Visit Date: 05/27/2024 07:23 am ---------------------------------------------------------------------- Performed By ...     US  MFM FETAL BPP WO NON STRESS Final result 05/27/2024       ----------------------------------------------------------------------  OBSTETRICS REPORT                       (Signed Final 05/27/2024 08:53 am) ---------------------------------------------------------------------- Patient Info   ID #:       [id number]                          D.O.B.:  1986/05/05 (38 yrs)(F)  Name:       Brandi Turner                          Visit Date: 05/27/2024 07:23 am ---------------------------------------------------------------------- Performed By ...   Study Result  Narrative &  Impression    ----------------------------------------------------------------------  OBSTETRICS REPORT                       (Signed Final 05/27/2024 08:53 am) ---------------------------------------------------------------------- Patient Info    ID #:       [id number]                          D.O.B.:  [birth year] (38 yrs)(F)  Name:       Brandi Turner                          Visit Date: 05/27/2024 07:23 am ---------------------------------------------------------------------- Performed By    Attending:        Fredia Fresh MD        Ref. Address:     [address]                                                             [place], [place]                                                             [address]  Performed By:     Vinetta Dawn RDMS      [facility]  Referred By:      [facility] ---------------------------------------------------------------------- Orders    #  Description                           Code  Ordered By  1  US  MFM OB FOLLOW UP                   [id number]    RAVI SHANKAR  2  US  MFM UA CORD DOPPLER                76820.02    RAVI SHANKAR  3  US  MFM FETAL BPP WO NON               76819.01    RAVI SHANKAR     STRESS ----------------------------------------------------------------------    #  Order #                     Accession #                Episode #  1  [id number]                   [id number]                 [id number]  2  [id number]                   [id number]                 [id number]  3  [id number]                   [id number]                 [id number] ---------------------------------------------------------------------- Indications    Maternal care for known or suspected poor      O36.5931  fetal growth, third trimester, fetus 1 IUGR  Advanced maternal age multigravida 84+,        O48.523  third trimester (38 years)  Late to prenatal care, third trimester         O09.33  Genetic carrier (silent carrier alpha thal)    Z14.8  Encounter for antenatal screening for          Z36.3  malformations  Encounter for uncertain dates                  Z36.87  [redacted] weeks gestation of pregnancy                Z3A.35  LR female, neg AFP, GTT WNL ---------------------------------------------------------------------- Vital Signs    BP:          126/80 ---------------------------------------------------------------------- Fetal Evaluation    Num Of Fetuses:         1  Fetal Heart Rate(bpm):  137  Cardiac Activity:       Observed  Presentation:           Cephalic  Placenta:               Anterior  P. Cord Insertion:       Not well visualized  Amniotic Fluid  AFI FV:      Within normal limits    AFI Sum(cm)     %Tile       Largest Pocket(cm)  10.97           28          4.21    RUQ(cm)       RLQ(cm)       LUQ(cm)        LLQ(cm)  4.21          2.3  2.04           2.42 ---------------------------------------------------------------------- Biophysical Evaluation    Amniotic F.V:   Within normal limits       F. Tone:        Observed  F. Movement:    Observed                   Score:          8/8  F. Breathing:   Observed ---------------------------------------------------------------------- Biometry    BPD:      91.2  mm     G. Age:  37w 0d         93  %    CI:         86.4   %    70 - 86                                                          FL/HC:      19.7   %    20.1 - 22.3  HC:      308.9  mm     G. Age:  34w 3d          8  %    HC/AC:      1.10        0.93 - 1.11  AC:      280.7  mm     G. Age:  32w 1d        1.6  %    FL/BPD:     66.8   %    71 - 87  FL:       60.9  mm     G. Age:  31w 4d        < 1  %    FL/AC:      21.7   %    20 - 24    Est. FW:    2011  gm      4 lb 7 oz    3.2  % ---------------------------------------------------------------------- OB History    Blood Type:   A+  Gravidity:    5         Term:   4        Prem:   0        SAB:   0  TOP:          0       Ectopic:  0        Living: 4 ---------------------------------------------------------------------- Gestational Age    U/S Today:     33w 6d                                        EDD:   07/09/24  Best:          35w 1d     Det. By:  U/S  (04/29/24)          EDD:   06/30/24 ---------------------------------------------------------------------- Anatomy    Cranium:               Previously seen        Aortic Arch:  Previously seen  Cavum:                 Previously seen        Ductal Arch:            Previously seen  Ventricles:            Appears normal         Diaphragm:              Appears normal   Choroid Plexus:        Previously seen        Stomach:                Appears normal, left                                                                        sided  Cerebellum:            Previously seen        Abdomen:                Previously seen  Posterior Fossa:       Previously seen        Abdominal Wall:         Previously seen  Face:                  Profile appears        Cord Vessels:           Previously seen                         normal  Lips:                  Previously seen        Kidneys:                Appear normal  Thoracic:              Previously seen        Bladder:                Appears normal  Heart:                 Appears normal         Spine:                  Previously seen                         (4CH, axis, and                         situs)  RVOT:                  Previously seen        Upper Extremities:      Previously seen  LVOT:                  Previously seen        Lower Extremities:      Previously seen    Other:  ACI PCI  Profile cleared today CP Hands not well vis ---------------------------------------------------------------------- Doppler - Fetal Vessels  Umbilical Artery   S/D     %tile      RI    %tile                      PSV                                                     (cm/s)   2.96       78    0.66       81                     43.72   ---------------------------------------------------------------------- Cervix Uterus Adnexa    Cervix  Not visualized (advanced GA >24wks)    Uterus  No abnormality visualized.    Right Ovary  Not visualized.    Left Ovary  Not visualized.    Cul De Sac  No free fluid seen.    Adnexa  No abnormality visualized ---------------------------------------------------------------------- Impression  G5 P4004 at 35w 1d gestation.  Patient is here for fetal  growth assessment and completion of fetal anatomy.  Blood pressure today at our office is 126/80 mmHg.    Obstetrical history  significant for 4 term vaginal deliveries.  All  her children weighed between 5 and 6 pounds at birth.    Ultrasound  On today's ultrasound, the estimated fetal weight is at the 3rd  percentile and the abdominal circumference measurement at  the 2nd percentile.  Amniotic fluid is normal good fetal activity  seen.  Cephalic presentation.  Umbilical artery Doppler  showed normal forward diastolic flow.  Antenatal testing is  reassuring.  BPP 8/8.    Fetal growth restriction  I explained the finding of severe fetal growth restriction that is  difficult to differentiate from a constitutionally small for  gestational age fetus. Most fetuses are small but not growth  restricted. Her pregnancy is dated by third-trimester  ultrasound, which is suboptimal. Patient is 5 feet tall.    I discussed the possible causes of fetal growth restriction  including placental insufficiency (most common cause),  chromosomal anomalies and fetal infections.  Patient did not  give history of fever or rashes.    I discussed ultrasound protocol of monitoring fetal growth  restriction.  Timing of delivery: Severe fetal growth restriction is  associated with increased risk of perinatal mortality. I  recommended delivery at 37 weeks' gestation.  Patient would like to delay her delivery to 39 weeks because  of her history of previous small babies at birth.    I counseled the patient with the help of language interpreter  present in the room. ---------------------------------------------------------------------- Recommendations    - Weekly BPP, NST and UA Doppler studies till delivery.  - Delivery at [redacted] weeks gestation provided antenatal testing  remains reassuring. ----------------------------------------------------------------------                 Fredia Fresh, MD Electronically Signed Final Report   05/27/2024 08:53 am ----------------------------------------------------------------------         Assessment and Plan:  Pregnancy: H4E5995 at [redacted]w[redacted]d  1. Supervision of Risk Risk Pregnancy, antepartum (Primary)  2. Antepartum multigravida of Advanced Maternal Age  85. Fetal Growth Restriction antepartum - weekly fetal assessment by  MFM and ultrasound every 3-4 weeks for growth - delivery at 39 weeks, per Dr. Arna  4. Alpha thalassemia silent carrier     Term labor symptoms and general obstetric precautions including but not limited to vaginal bleeding, contractions, leaking of fluid and fetal movement were reviewed in detail with the patient. Please refer to After Visit Summary for other counseling recommendations.   Return in about 1 week (around 06/18/2024) for [facility].   Rudy Carlin LABOR, MD 06/11/2024

## 2024-06-14 ENCOUNTER — Encounter (HOSPITAL_COMMUNITY): Payer: Self-pay

## 2024-06-14 ENCOUNTER — Encounter (HOSPITAL_COMMUNITY): Payer: Self-pay | Admitting: Obstetrics and Gynecology

## 2024-06-14 ENCOUNTER — Encounter (HOSPITAL_COMMUNITY): Payer: Self-pay | Admitting: Obstetrics

## 2024-06-14 ENCOUNTER — Other Ambulatory Visit: Payer: Self-pay

## 2024-06-14 ENCOUNTER — Inpatient Hospital Stay (HOSPITAL_COMMUNITY)
Admission: AD | Admit: 2024-06-14 | Discharge: 2024-06-16 | DRG: 807 | Disposition: A | Discharge status: Home / Self Care | Attending: Obstetrics and Gynecology | Admitting: Obstetrics and Gynecology

## 2024-06-14 ENCOUNTER — Inpatient Hospital Stay (HOSPITAL_COMMUNITY)
Admission: AD | Admit: 2024-06-14 | Discharge: 2024-06-14 | Disposition: A | Discharge status: Home / Self Care | Attending: Obstetrics and Gynecology | Admitting: Obstetrics and Gynecology

## 2024-06-14 DIAGNOSIS — Z148 Genetic carrier of other disease: Secondary | ICD-10-CM

## 2024-06-14 DIAGNOSIS — Z603 Acculturation difficulty: Secondary | ICD-10-CM | POA: Diagnosis present

## 2024-06-14 DIAGNOSIS — O26893 Other specified pregnancy related conditions, third trimester: Secondary | ICD-10-CM | POA: Diagnosis present

## 2024-06-14 DIAGNOSIS — O365930 Maternal care for other known or suspected poor fetal growth, third trimester, not applicable or unspecified: Secondary | ICD-10-CM | POA: Diagnosis present

## 2024-06-14 DIAGNOSIS — Z3480 Encounter for supervision of other normal pregnancy, unspecified trimester: Secondary | ICD-10-CM

## 2024-06-14 DIAGNOSIS — O471 False labor at or after 37 completed weeks of gestation: Secondary | ICD-10-CM | POA: Insufficient documentation

## 2024-06-14 DIAGNOSIS — Z3A37 37 weeks gestation of pregnancy: Secondary | ICD-10-CM

## 2024-06-14 DIAGNOSIS — O133 Gestational [pregnancy-induced] hypertension without significant proteinuria, third trimester: Secondary | ICD-10-CM

## 2024-06-14 DIAGNOSIS — O09523 Supervision of elderly multigravida, third trimester: Secondary | ICD-10-CM | POA: Diagnosis not present

## 2024-06-14 DIAGNOSIS — O134 Gestational [pregnancy-induced] hypertension without significant proteinuria, complicating childbirth: Secondary | ICD-10-CM | POA: Diagnosis present

## 2024-06-14 LAB — CBC
HCT: 35.2 % — ABNORMAL LOW (ref 36.0–46.0)
Hemoglobin: 11.5 g/dL — ABNORMAL LOW (ref 12.0–15.0)
MCH: 25.9 pg — ABNORMAL LOW (ref 26.0–34.0)
MCHC: 32.7 g/dL (ref 30.0–36.0)
MCV: 79.3 fL — ABNORMAL LOW (ref 80.0–100.0)
Platelets: 281 K/uL (ref 150–400)
RBC: 4.44 MIL/uL (ref 3.87–5.11)
RDW: 13.5 % (ref 11.5–15.5)
WBC: 9.4 K/uL (ref 4.0–10.5)
nRBC: 0 % (ref 0.0–0.2)

## 2024-06-14 LAB — TYPE AND SCREEN
ABO/RH(D): A POS
Antibody Screen: NEGATIVE

## 2024-06-14 MED ORDER — OXYTOCIN BOLUS FROM INFUSION
333.0000 mL | Freq: Once | INTRAVENOUS | Status: AC
  Administered 2024-06-14: 333 mL via INTRAVENOUS

## 2024-06-14 MED ORDER — OXYCODONE HCL 5 MG PO TABS: 5.0000 mg | ORAL_TABLET | ORAL | Status: DC | PRN

## 2024-06-14 MED ORDER — FLEET ENEMA RE ENEM
1.0000 | ENEMA | RECTAL | Status: DC | PRN
Start: 2024-06-14 — End: 2024-06-14

## 2024-06-14 MED ORDER — OXYCODONE-ACETAMINOPHEN 5-325 MG PO TABS: 1.0000 | ORAL_TABLET | ORAL | Status: DC | PRN

## 2024-06-14 MED ORDER — OXYTOCIN-SODIUM CHLORIDE 30-0.9 UT/500ML-% IV SOLN
2.5000 [IU]/h | INTRAVENOUS | Status: DC
  Administered 2024-06-14: 2.5 [IU]/h via INTRAVENOUS
  Filled 2024-06-14: qty 500

## 2024-06-14 MED ORDER — ONDANSETRON HCL 4 MG/2ML IJ SOLN: 4.0000 mg | INTRAMUSCULAR | Status: DC | PRN

## 2024-06-14 MED ORDER — SIMETHICONE 80 MG PO CHEW: 80.0000 mg | CHEWABLE_TABLET | ORAL | Status: DC | PRN

## 2024-06-14 MED ORDER — WITCH HAZEL-GLYCERIN EX PADS: 1.0000 | MEDICATED_PAD | CUTANEOUS | Status: DC | PRN

## 2024-06-14 MED ORDER — SOD CITRATE-CITRIC ACID 500-334 MG/5ML PO SOLN: 30.0000 mL | ORAL | Status: DC | PRN

## 2024-06-14 MED ORDER — OXYCODONE-ACETAMINOPHEN 5-325 MG PO TABS: 2.0000 | ORAL_TABLET | ORAL | Status: DC | PRN

## 2024-06-14 MED ORDER — LIDOCAINE HCL (PF) 1 % IJ SOLN: 30.0000 mL | INTRAMUSCULAR | Status: DC | PRN

## 2024-06-14 MED ORDER — DIBUCAINE (PERIANAL) 1 % EX OINT: 1.0000 | TOPICAL_OINTMENT | CUTANEOUS | Status: DC | PRN

## 2024-06-14 MED ORDER — ONDANSETRON HCL 4 MG PO TABS: 4.0000 mg | ORAL_TABLET | ORAL | Status: DC | PRN

## 2024-06-14 MED ORDER — LACTATED RINGERS IV SOLN: INTRAVENOUS | Status: DC

## 2024-06-14 MED ORDER — ONDANSETRON HCL 4 MG/2ML IJ SOLN: 4.0000 mg | Freq: Four times a day (QID) | INTRAMUSCULAR | Status: DC | PRN

## 2024-06-14 MED ORDER — ACETAMINOPHEN 325 MG PO TABS: 650.0000 mg | ORAL_TABLET | ORAL | Status: DC | PRN

## 2024-06-14 MED ORDER — BENZOCAINE-MENTHOL 20-0.5 % EX AERO
1.0000 | INHALATION_SPRAY | CUTANEOUS | Status: DC | PRN
Start: 2024-06-14 — End: 2024-06-16

## 2024-06-14 MED ORDER — ZOLPIDEM TARTRATE 5 MG PO TABS: 5.0000 mg | ORAL_TABLET | Freq: Every evening | ORAL | Status: DC | PRN

## 2024-06-14 MED ORDER — DIPHENHYDRAMINE HCL 25 MG PO CAPS: 25.0000 mg | ORAL_CAPSULE | Freq: Four times a day (QID) | ORAL | Status: DC | PRN

## 2024-06-14 MED ORDER — OXYCODONE HCL 5 MG PO TABS: 10.0000 mg | ORAL_TABLET | ORAL | Status: DC | PRN

## 2024-06-14 MED ORDER — LACTATED RINGERS IV SOLN: 500.0000 mL | INTRAVENOUS | Status: DC | PRN

## 2024-06-14 MED ORDER — PRENATAL MULTIVITAMIN CH
1.0000 | ORAL_TABLET | Freq: Every day | ORAL | Status: DC
  Administered 2024-06-15 – 2024-06-16 (×2): 1 via ORAL
  Filled 2024-06-14 (×2): qty 1

## 2024-06-14 MED ORDER — SENNOSIDES-DOCUSATE SODIUM 8.6-50 MG PO TABS
2.0000 | ORAL_TABLET | Freq: Every day | ORAL | Status: DC
  Administered 2024-06-15 – 2024-06-16 (×2): 2 via ORAL
  Filled 2024-06-14 (×2): qty 2

## 2024-06-14 MED ORDER — IBUPROFEN 600 MG PO TABS
600.0000 mg | ORAL_TABLET | Freq: Four times a day (QID) | ORAL | Status: DC
  Administered 2024-06-14 – 2024-06-16 (×8): 600 mg via ORAL
  Filled 2024-06-14 (×8): qty 1

## 2024-06-14 MED ORDER — COCONUT OIL OIL: 1.0000 | TOPICAL_OIL | Status: DC | PRN

## 2024-06-14 NOTE — Discharge Summary (Signed)
 Postpartum Discharge Summary  Date of Service updated***     Patient Name: Brandi Turner DOB: 1985/12/24 MRN: [id number]  Date of admission: 06/14/2024 Delivery date:06/14/2024 Delivering provider: LONA MERRITTS Date of discharge: 06/14/2024  Admitting diagnosis: Normal labor [O80, Z37.9] Intrauterine pregnancy: [redacted]w[redacted]d     Secondary diagnosis:  Active Problems:   Spontaneous vaginal delivery  Additional problems: AMA, alpha thalessemia, FGR 3.2%, gHTN at admission with pressures resolved after delivery   Discharge diagnosis: Term Pregnancy Delivered                                              Post partum procedures:{Postpartum procedures:23558} Augmentation: AROM forebag Complications: None  Hospital course: Onset of Labor With Vaginal Delivery      38 y.o. yo H4E5995 at [redacted]w[redacted]d was admitted in Active Labor on 06/14/2024. Labor course was uncomplicated.  Membrane Rupture Time/Date: 5:00 PM,06/14/2024  Delivery Method:Vaginal, Spontaneous Operative Delivery:N/A Episiotomy: None Lacerations:  1st degree;Perineal Patient had a postpartum course complicated by ***.  She is ambulating, tolerating a regular diet, passing flatus, and urinating well. Patient is discharged home in stable condition on 06/14/24.  Newborn Data: Birth date:06/14/2024 Birth time:5:14 PM Gender:Female Living status:  Apgars: ,  Weight:   Magnesium Sulfate received: No BMZ received: No Rhophylac:No MMR:N/A T-DaP:Given prenatally Flu: No RSV Vaccine received: No Transfusion:{Transfusion received:30440034}  Immunizations received: Immunization History  Administered Date(s) Administered   PFIZER(Purple Top)SARS-COV-2 Vaccination 02/23/2020, 03/15/2020   Tdap 06/07/2011, 05/05/2024    Physical exam  Vitals:   06/14/24 1603 06/14/24 1630 06/14/24 1702 06/14/24 1735  BP: 135/70 123/83 (!) 146/87 (!) 141/83  Pulse: 81 88 75 80  Resp: 18 20 19 20   Temp: 97.9 F (36.6 C)     TempSrc: Oral     SpO2:        General: {Exam; general:21111117} Lochia: {Desc; appropriate/inappropriate:30686::appropriate} Uterine Fundus: {Desc; firm/soft:30687} Incision: {Exam; incision:21111123} DVT Evaluation: {Exam; dvt:2111122} Labs: Lab Results  Component Value Date   WBC 9.4 06/14/2024   HGB 11.5 (L) 06/14/2024   HCT 35.2 (L) 06/14/2024   MCV 79.3 (L) 06/14/2024   PLT 281 06/14/2024      Latest Ref Rng & Units 05/05/2023    4:12 PM  CMP  Glucose 70 - 99 mg/dL 890   BUN 6 - 20 mg/dL 8   Creatinine 9.55 - 8.99 mg/dL 9.34   Sodium 864 - 854 mmol/L 134   Potassium 3.5 - 5.1 mmol/L 3.4   Chloride 98 - 111 mmol/L 103   CO2 22 - 32 mmol/L 21   Calcium 8.9 - 10.3 mg/dL 9.4   Total Protein 6.5 - 8.1 g/dL 7.7   Total Bilirubin 0.3 - 1.2 mg/dL 0.4   Alkaline Phos 38 - 126 U/L 39   AST 15 - 41 U/L 20   ALT 0 - 44 U/L 16    Edinburgh Score:     No data to display         No data recorded  After visit meds:  Allergies as of 06/14/2024   No Known Allergies   Med Rec must be completed prior to using this [facility]***        Discharge home in stable condition Infant Feeding: Breast Infant Disposition:home with mother Discharge instruction: per After Visit Summary and Postpartum booklet. Activity: Advance as tolerated. Pelvic rest for 6 weeks.  Diet: routine diet Future Appointments: Future Appointments  Date Time Provider Department Center  06/17/2024  9:15 AM Littie Olam LABOR, NP [facility] None  06/18/2024  2:00 PM [facility] PROVIDER 1 [facility]  06/18/2024  2:15 PM [facility] NST [facility]  06/18/2024  3:30 PM [facility] US4 [facility]  06/24/2024  6:45 AM [facility] SCHED ROOM [facility] None   Follow up Visit:   Please schedule this patient for a In person postpartum visit in 4 weeks with the following provider: Any provider. Additional Postpartum F/U:none  High risk pregnancy complicated by: FGR,  Delivery mode:  Vaginal, Spontaneous Anticipated Birth Control:  Natural Family  Planning   06/14/2024 Leeroy KATHEE Pouch, MD

## 2024-06-14 NOTE — Procedures (Addendum)
 LABOR COURSE G5P4004 at 37+5 presented for SOL, AROM at 1700 for clear fluid.  Delivery Note Called to room and patient was complete and pushing. Head delivered ROA. No nuchal cord present. Shoulder and body delivered in usual fashion.   At 1714 a viable female was delivered via Vaginal, Spontaneous (Presentation: vertex).  Infant with spontaneous cry, placed on mother's abdomen, dried and stimulated. Cord clamped x 2 after >1-minute delay, and cut by father of the baby. Cord blood drawn. Placenta delivered spontaneously with gentle cord traction. Appears intact. Fundus firm with massage and Pitocin . Labia, perineum, vagina, and cervix inspected with hemostatic first degree perineal laceration noted.    APGAR: 9, 9; 2460g Cord: 3VC with the following complications: none.    Anesthesia:  none Episiotomy: None Lacerations: first degree perineal, hemostatic Suture Repair: none required Est. Blood Loss (mL): 30  Mom to postpartum.  Baby to Couplet care / Skin to Skin.  Lona Merritts, MD 06/14/24 5:59 PM     GME ATTESTATION:  Evaluation and management procedures were performed by the [facility] Medicine Resident under my supervision. I was immediately available for direct supervision, assistance and direction throughout this encounter.  I also confirm that I have verified the information documented in the resident's note, and that I have also personally reperformed the pertinent components of the physical exam and all of the medical decision making activities.  I have also made any necessary editorial changes.  Leeroy KATHEE Pouch, MD OB Fellow, [facility], Center for [facility] 06/14/2024 6:54 PM

## 2024-06-14 NOTE — MAU Note (Signed)
 Brandi Turner is a 38 y.o. at [redacted]w[redacted]d here in MAU reporting: contractions are coming faster, stronger than they were earlier.  Felt gush of fluid at 1300, none coming currently. Clear and watery, little bit of blood.  Onset of complaint: ongoing Pain score: 8/9 Vitals:   06/14/24 1428  BP: (!) 126/98  Pulse: 89  Resp: 16  Temp: 97.9 F (36.6 C)  SpO2: 99%     QYU:imzdd on, reports +FM Lab orders placed from triage:

## 2024-06-14 NOTE — Progress Notes (Signed)
 MAU Labor Evaluation RN Medical Screening Exam:  RN Assessment:   .Brandi Turner is a 38 y.o. at [redacted]w[redacted]d here in MAU for evaluation of labor.   Pain Score: 5  Pain Location: Abdomen  Cervical exam:  Dilation: 3 Effacement (%): 50 Cervical Position: Posterior Station: -3 Presentation: Vertex Exam by:: K. Eleaner Dibartolo RN  Fetal Monitoring: Baseline Rate (A): 130 bpm Variability: Moderate Accelerations: 10 x 10 and 15 x 15 Decelerations: None Contraction Frequency (min): x2  Vitals:   06/14/24 0902 06/14/24 1006  BP: 129/88 126/88  Pulse: 83 90      Medical Decision Making:   Provider Name/Title: Dr. Trudy Notification Reason: Vaginal exam, Uterine activity, Membrane status, Maternal vital sign(s) Provider response:  (DC home)  Plan of Care: Discharge home with strict labor precautions

## 2024-06-14 NOTE — MAU Provider Note (Signed)
 Maternal Assessment Unit Provider Note  Subjective: [name] is a 38 y.o. H4E5995 pregnan female at [redacted]w[redacted]d who presents to MAU today with complaint of contractions.   Per RN exam & report, came in dilated to 3/50/-3 with mild bloody show and was unchanged after 1 hour.  No other concerns today. Already scheduled for IOL at 39wk for mild FGR  Objective: BP 126/88   Pulse 90   Ht 5' 1 (1.549 m)   Wt 59.7 kg   LMP  (LMP Unknown) Comment: towards the end of December  BMI 24.88 kg/m    MDM: Low risk  MAU Course:  Time: 26  FHT: baseline 135 bpm, moderate variability, + accelerations, no decelerations,   Contractions: q 6 mins   Assessment    ICD-10-CM   1. Supervision of other normal pregnancy, antepartum  Z34.80        Plan Discharge from MAU in stable condition with strict labor precautions Follow up at [facility] as scheduled for ongoing prenatal care    Brandi Leeroy NOVAK, MD 06/14/2024 10:20 AM

## 2024-06-14 NOTE — H&P (Cosign Needed Addendum)
 OBSTETRIC ADMISSION HISTORY AND PHYSICAL History obtained through patient's sister in law who interpreted for her per patient request. Video interpretor was declined.  Brandi Turner is a 38 y.o. female H4E5995 with IUP at [redacted]w[redacted]d by 31 week ultrasound with MFM presenting for SOL. Reports contractions beginning this morning and worsening with LOF at ~1300. She reports +FMs, + LOF, no VB, no blurry vision, headaches or peripheral edema, and RUQ pain.  She plans on breast feeding. She request natural family planning for birth control. She received her prenatal care at [facility]   Dating: By 3rd trimester US  --->  Estimated Date of Delivery: 06/30/24  Sono:    @[redacted]w[redacted]d , normal anatomy, cephalic presentation, anterior placenta lie, 2011g, 3.2% EFW   Prenatal History/Complications: AMA, late to prenatal care, silent alpha thal carrier  Past Medical History: Past Medical History:  Diagnosis Date   Medical history non-contributory    No pertinent past medical history     Past Surgical History: Past Surgical History:  Procedure Laterality Date   NO PAST SURGERIES      Obstetrical History: OB History     Gravida  5   Para  4   Term  4   Preterm  0   AB  0   Living  4      SAB  0   IAB  0   Ectopic  0   Multiple  0   Live Births  4           Social History Social History   Socioeconomic History   Marital status: Married    Spouse name: Not on file   Number of children: Not on file   Years of education: Not on file   Highest education level: Not on file  Occupational History   Not on file  Tobacco Use   Smoking status: Never   Smokeless tobacco: Never  Vaping Use   Vaping status: Never Used  Substance and Sexual Activity   Alcohol use: No   Drug use: No   Sexual activity: Yes    Birth control/protection: None  Other Topics Concern   Not on file  Social History Narrative   Lives with 4 children and husband   From [place]   Social Drivers of  Corporate investment banker Strain: Not on file  Food Insecurity: No Food Insecurity (06/14/2024)   Hunger Vital Sign    Worried About Running Out of Food in the Last Year: Never true    Ran Out of Food in the Last Year: Never true  Transportation Needs: No Transportation Needs (06/14/2024)   PRAPARE - Administrator, Civil Service (Medical): No    Lack of Transportation (Non-Medical): No  Physical Activity: Not on file  Stress: Not on file  Social Connections: Not on file    Family History: Family History  Problem Relation Age of Onset   Healthy Mother    Healthy Father    Asthma Neg Hx    Cancer Neg Hx    Diabetes Neg Hx    Heart disease Neg Hx    Hypertension Neg Hx     Allergies: No Known Allergies  Medications Prior to Admission  Medication Sig Dispense Refill Last Dose/Taking   Prenatal Vit-Fe Phos-FA-Omega (VITAFOL  GUMMIES) 3.33-0.333-34.8 MG CHEW Chew 3 tablets by mouth daily. 90 tablet 11      Review of Systems   All systems reviewed and negative except as stated in  HPI  Blood pressure (!) 143/85, pulse 74, temperature 97.9 F (36.6 C), temperature source Oral, resp. rate 18, SpO2 99%. General appearance: alert and cooperative Lungs: clear to auscultation bilaterally Heart: regular rate and rhythm Abdomen: soft, non-tender; bowel sounds normal Pelvic: WNL external genitalia Extremities: Homans sign is negative, no sign of DVT Presentation: cephalic Fetal monitoring Baseline: 130 bpm, Variability: Good {> 6 bpm), Accelerations: none, and Decelerations: Absent Uterine activity Frequency: Every 5 minutes SVE: 7/90/-2   Prenatal labs: ABO, Rh: --/--/A POS (08/23 1540) Antibody: NEG (08/23 1540) Rubella: 3.06 (04/09 0945) RPR: Non Reactive (06/30 0815)  HBsAg: Negative (04/09 0945)  HIV: Non Reactive (06/30 0815)  GBS: Negative/-- (08/11 0919)    Lab Results  Component Value Date   GBS Negative 06/02/2024   GTT WNL Genetic screening   NIPS LR female; Horizon: silent alpha thal carrier Anatomy US  WNL  Immunization History  Administered Date(s) Administered   PFIZER(Purple Top)SARS-COV-2 Vaccination 02/23/2020, 03/15/2020   Tdap 06/07/2011, 05/05/2024    Prenatal Transfer Tool  Maternal Diabetes: No Genetic Screening: Abnormal:  Results: Other: Maternal Ultrasounds/Referrals: Other:FGR Fetal Ultrasounds or other Referrals:  Referred to Materal Fetal Medicine  Maternal Substance Abuse:  No Significant Maternal Medications:  None Significant Maternal Lab Results: Group B Strep negative Number of Prenatal Visits:greater than 3 verified prenatal visits Maternal Vaccinations:TDap Other Comments:  Alpha thal silent carrier   Results for orders placed or performed during the hospital encounter of 06/14/24 (from the past 24 hours)  Type and screen [facility]   Collection Time: 06/14/24  3:40 PM  Result Value Ref Range   ABO/RH(D) A POS    Antibody Screen NEG    Sample Expiration      06/17/2024,2359 Performed at [facility], [address], [place], [place] [address]   CBC   Collection Time: 06/14/24  3:47 PM  Result Value Ref Range   WBC 9.4 4.0 - 10.5 K/uL   RBC 4.44 3.87 - 5.11 MIL/uL   Hemoglobin 11.5 (L) 12.0 - 15.0 g/dL   HCT 64.7 (L) 63.9 - 53.9 %   MCV 79.3 (L) 80.0 - 100.0 fL   MCH 25.9 (L) 26.0 - 34.0 pg   MCHC 32.7 30.0 - 36.0 g/dL   RDW 86.4 88.4 - 84.4 %   Platelets 281 150 - 400 K/uL   nRBC 0.0 0.0 - 0.2 %    Patient Active Problem List   Diagnosis Date Noted   Spontaneous vaginal delivery 06/14/2024   Fetal growth restriction antepartum 06/02/2024   Antepartum multigravida of advanced maternal age 29/29/2025   Alpha thalassemia silent carrier 02/11/2024   Supervision of other normal pregnancy, antepartum 01/30/2024    Assessment/Plan:  Brandi Turner is a 38 y.o. H4E5995 at [redacted]w[redacted]d here for SOL   #Labor: Continue expectant management #Pain: Declines epidural, open to  other options #FWB: Category 1 #GBS status:  negative #Feeding: Breastmilk  #Reproductive Life planning: Natural Family Planning #Circ:  not applicable  Coolidge Moros, MD  06/14/2024, 5:58 PM   GME ATTESTATION:  Evaluation and management procedures were performed by the [facility] Medicine Resident under my supervision. I was immediately available for direct supervision, assistance and direction throughout this encounter.  I also confirm that I have verified the information documented in the resident's note, and that I have also personally reperformed the pertinent components of the physical exam and all of the medical decision making activities.  I have also made any necessary editorial changes.  Leeroy KATHEE Pouch, MD OB Fellow, Faculty Practice [facility], Center for [facility] 06/14/2024 8:48 PM

## 2024-06-15 LAB — COMPREHENSIVE METABOLIC PANEL WITH GFR
ALT: 23 U/L (ref 0–44)
AST: 33 U/L (ref 15–41)
Albumin: 2.5 g/dL — ABNORMAL LOW (ref 3.5–5.0)
Alkaline Phosphatase: 171 U/L — ABNORMAL HIGH (ref 38–126)
Anion gap: 5 (ref 5–15)
BUN: 5 mg/dL — ABNORMAL LOW (ref 6–20)
CO2: 23 mmol/L (ref 22–32)
Calcium: 8.9 mg/dL (ref 8.9–10.3)
Chloride: 106 mmol/L (ref 98–111)
Creatinine, Ser: 0.59 mg/dL (ref 0.44–1.00)
GFR, Estimated: >60 mL/min (ref >60)
Glucose, Bld: 74 mg/dL (ref 70–99)
Potassium: 3.8 mmol/L (ref 3.5–5.1)
Sodium: 134 mmol/L — ABNORMAL LOW (ref 135–145)
Total Bilirubin: 0.5 mg/dL (ref 0.0–1.2)
Total Protein: 5.8 g/dL — ABNORMAL LOW (ref 6.5–8.1)

## 2024-06-15 LAB — RPR: RPR Ser Ql: NONREACTIVE

## 2024-06-15 MED ORDER — POTASSIUM CHLORIDE CRYS ER 20 MEQ PO TBCR
40.0000 meq | EXTENDED_RELEASE_TABLET | Freq: Every day | ORAL | Status: DC
  Administered 2024-06-16: 40 meq via ORAL
  Filled 2024-06-15 (×2): qty 2

## 2024-06-15 MED ORDER — FUROSEMIDE 20 MG PO TABS
20.0000 mg | ORAL_TABLET | Freq: Every day | ORAL | Status: DC
  Administered 2024-06-16: 20 mg via ORAL
  Filled 2024-06-15 (×2): qty 1

## 2024-06-15 NOTE — Lactation Note (Signed)
 This note was copied from a baby's chart. Lactation Consultation Note  Patient Name: Brandi Turner Unijb'd Date: 06/15/2024 Age:36 hours Reason for consult: Initial assessment;Primapara;Early term 37-38.6wks;Infant < 6lbs Baby latched onto the breast and the baby BF great. Hearing swallows. Discussed w/mom about pumping d/t baby SGA. Mom in agreement to pump. After BF LC set up Pump. Mom shown how to use DEBP & how to disassemble, clean, & reassemble parts. Instructed to pump q 3hr Mom pumped while in room. Collected milk easily. Praised mom. Gave parents Plan of care feeding card. Parents states understanding. Instructed dad on how to feed baby. Mom encouraged to feed baby 8-12 times/24 hours and with feeding cues.  Newborn feeding habits and behavior of SGA baby reviewed. Encouraged to do STS a lot and keep good I&O. Reviewed body alignment and support while feeding. Answer parents questions.  Feeding Plan: Mom is to BF baby no longer than 10 min. Dad is to supplement w/mom's BM first then if needed give formula to equal the amount needed. Mom is to use DEBP. Give this milk for the next feeding.  Maternal Data Has patient been taught Hand Expression?: Yes Does the patient have breastfeeding experience prior to this delivery?: Yes How long did the patient breastfeed?: 1 yr to each of her other 4 children  Feeding Mother's Current Feeding Choice: Breast Milk and Formula Nipple Type: Nfant Standard Flow (white)  LATCH Score Latch: Grasps breast easily, tongue down, lips flanged, rhythmical sucking.  Audible Swallowing: Spontaneous and intermittent  Type of Nipple: Everted at rest and after stimulation  Comfort (Breast/Nipple): Soft / non-tender  Hold (Positioning): No assistance needed to correctly position infant at breast.  LATCH Score: 10   Lactation Tools Discussed/Used Tools: Pump;Flanges Flange Size: 24 Breast pump type: Double-Electric Breast Pump Pump Education:  Setup, frequency, and cleaning;Milk Storage Reason for Pumping: less than 6 lbs Pumping frequency: q 3 hrs Pumped volume: 6 mL  Interventions Interventions: Breast feeding basics reviewed;Skin to skin;DEBP;Education;LC Psychologist, educational;Infant Driven Feeding Algorithm education;LPT handout/interventions;CDC milk storage guidelines;Pace feeding  Discharge Pump: Referral sent for [facility] Pump [facility] Program: Yes  Consult Status Consult Status: Follow-up Date: 06/15/24 Follow-up type: In-patient    Santos Sollenberger G 06/15/2024, 1:27 AM

## 2024-06-15 NOTE — Progress Notes (Addendum)
 POSTPARTUM PROGRESS NOTE  Post Partum Day 1  [place] interpreter used for this encounter. Interpreter ID: [id number]  Subjective:  Brandi Turner is a 38 y.o. H4E4994 s/p NSVD at [redacted]w[redacted]d.  No acute events overnight.  Pt denies problems with ambulating, voiding or po intake.  She denies nausea or vomiting.  Pain is well controlled.  She has had flatus. She has not had bowel movement.  Lochia Minimal.   Objective: Blood pressure 109/75, pulse 72, temperature 97.8 F (36.6 C), temperature source Axillary, resp. rate 18, SpO2 98%, unknown if currently breastfeeding.  Physical Exam:  General: alert, cooperative and no distress Chest: no respiratory distress Heart:regular rate, distal pulses intact Abdomen: soft, nontender,  Uterine Fundus: firm, appropriately tender DVT Evaluation: No calf swelling or tenderness Extremities: no edema Skin: warm, dry  Recent Labs    06/14/24 1547  HGB 11.5*  HCT 35.2*    Assessment/Plan: Brandi Turner is a 38 y.o. H4E4994 s/p NSVD at 102w5d   PPD#1 - Doing well Contraception: Declines contraception Feeding: Breast Dispo: Plan for discharge likely 8/25.   LOS: 1 day   Sehgal, Sanchala, MD, CNM 06/15/2024, 2:36 PM    Attestation of Supervision of Student:  I confirm that I have verified the information documented in the resident's note and that I have also personally reperformed the history, physical exam and all medical decision making activities.  I have verified that all services and findings are accurately documented in this student's note; and I agree with management and plan as outlined in the documentation. I have also made any necessary editorial changes.    Barkley LITTIE Angles, MD OB Fellow 06/15/2024 3:01 PM

## 2024-06-15 NOTE — Lactation Note (Signed)
 This note was copied from a baby's chart. Lactation Consultation Note  Patient Name: Brandi Turner Unijb'd Date: 06/15/2024 Age:36 hours Reason for consult: Follow-up assessment;Early term 37-38.6wks;Infant < 6lbs F/u to see how feedings were going. Mom stated baby is latching and BF well. Mom is giving formula after BF. Mom hasn't been pumping. Mom stated she is putting baby on the breast. LC explained that mom needs to also pump because the baby is small and mom needs more stimulatin to the breast. Informed mom LC sent referral to get her a Stork pump. Mom appreciative. Mom doesn't appear to want to pump at this time. LC offered to assist, mom declined at this time. Mom stated her nipples are a little sore but they are OK. Encouraged for them to tell RN how much formula or how long baby is on the breast. Both are important.  Maternal Data    Feeding Mother's Current Feeding Choice: Breast Milk and Formula  LATCH Score       Type of Nipple: Everted at rest and after stimulation  Comfort (Breast/Nipple): Soft / non-tender         Lactation Tools Discussed/Used    Interventions    Discharge    Consult Status Consult Status: Follow-up Date: 06/16/24 Follow-up type: In-patient    Dahlton Hinde G 06/15/2024, 10:00 PM

## 2024-06-16 ENCOUNTER — Other Ambulatory Visit (HOSPITAL_COMMUNITY): Payer: Self-pay

## 2024-06-16 MED ORDER — IBUPROFEN 600 MG PO TABS
600.0000 mg | ORAL_TABLET | Freq: Four times a day (QID) | ORAL | 0 refills | Status: AC
  Filled 2024-06-16: qty 30, 8d supply, fill #0

## 2024-06-16 MED ORDER — SENNOSIDES-DOCUSATE SODIUM 8.6-50 MG PO TABS
2.0000 | ORAL_TABLET | Freq: Every day | ORAL | 0 refills | Status: AC
  Filled 2024-06-16: qty 60, 30d supply, fill #0

## 2024-06-16 MED ORDER — POTASSIUM CHLORIDE CRYS ER 20 MEQ PO TBCR
40.0000 meq | EXTENDED_RELEASE_TABLET | Freq: Every day | ORAL | 0 refills | Status: DC
Start: 2024-06-16 — End: 2024-06-16
  Filled 2024-06-16: qty 6, 3d supply, fill #0

## 2024-06-16 MED ORDER — FUROSEMIDE 20 MG PO TABS
20.0000 mg | ORAL_TABLET | Freq: Every day | ORAL | 0 refills | Status: AC
  Filled 2024-06-16: qty 6, 6d supply, fill #0

## 2024-06-16 MED ORDER — POTASSIUM CHLORIDE CRYS ER 20 MEQ PO TBCR
40.0000 meq | EXTENDED_RELEASE_TABLET | Freq: Every day | ORAL | 0 refills | Status: AC
Start: 2024-06-16 — End: ?
  Filled 2024-06-16: qty 12, 6d supply, fill #0

## 2024-06-16 MED ORDER — BENZOCAINE-MENTHOL 20-0.5 % EX AERO
1.0000 | INHALATION_SPRAY | CUTANEOUS | 3 refills | Status: AC | PRN
  Filled 2024-06-16: qty 78, fill #0

## 2024-06-16 NOTE — Progress Notes (Signed)
 Discharge paperwork completed via ipad interpreter in [place]

## 2024-06-16 NOTE — Patient Instructions (Addendum)
 Your appointment with Outpatient Lactation is: Date: 06/24/2024 Time: 10:00 am [facility] (First Floor) [address] [place]  Check in under baby's name.  Please bring your baby hungry along with your pump and a bottle of either formula or expressed breast milk. Please also bring your pump flanges and we welcome support people! If you need lactation assistance before your appointment, please call [phone] and press 4 for lactation.    ---  L?ch h?n c?a b?n v?i Trung tm Cho con b Ngo?i tr l: Ngy: 12/02/2023 Gi?: 10:00 sng Trung tm Y t? Ph? n? (T?ng 1) [address], [place], [place]  ??ng k d??i tn b.  Vui lng mang theo my ht s?a v bnh s?a cng th?c ho?c s?a m? ? v?t. Vui lng mang theo ph?u ht s?a v chng ti r?t hoan nghnh nh?ng ng??i h? tr?! N?u b?n c?n h? tr? cho con b tr??c cu?c h?n, vui lng g?i [phone] v nh?n phm 4 ?? ???c h? tr?.

## 2024-06-16 NOTE — Progress Notes (Signed)
 Unable to print out discharge paperwork. Have tried printing on 4th floor [employer] as well, no able to. Mother still in system as patient, discharge paperwork has not been completed yet.

## 2024-06-17 ENCOUNTER — Encounter: Admitting: Nurse Practitioner

## 2024-06-17 NOTE — Lactation Note (Signed)
 This note was copied from a baby's chart. Lactation Consultation Note  Patient Name: Brandi Turner Unijb'd Date: 06/17/2024 Age:36 hours Reason for consult: Follow-up assessment;Early term 44-38.6wks Mom latched baby and baby BF great. Mom's breast are filling. Mom hasn't been pumping. LC set mom up and got her pumping. Encouraged her to pump q 3 hrs and give back mom's breast milk. Dad stated the baby doesn't like the formula. LC stated that's understandable she likes mommy milk, so mom pump her some mommy milk and give it back to her extra to help her gain weight. Mom stated OK. [facility] sent stork referral. LC will get day LC to check on the status for that stork pump. Mom doesn't have a pump. Praised mom for her hard work giving baby BM.   Maternal Data    Feeding    LATCH Score Latch: Grasps breast easily, tongue down, lips flanged, rhythmical sucking.  Audible Swallowing: Spontaneous and intermittent  Type of Nipple: Everted at rest and after stimulation  Comfort (Breast/Nipple): Soft / non-tender  Hold (Positioning): No assistance needed to correctly position infant at breast.  LATCH Score: 10   Lactation Tools Discussed/Used Tools: Pump Breast pump type: Double-Electric Breast Pump  Interventions Interventions: Breast feeding basics reviewed;Skin to skin;Breast compression;DEBP  Discharge Pump: Referral sent for [facility] Pump  Consult Status Consult Status: Follow-up Date: 06/17/24 Follow-up type: In-patient    [name] 06/17/2024, 4:32 AM

## 2024-06-17 NOTE — Lactation Note (Signed)
 This note was copied from a baby's chart. Lactation Consultation Note  Patient Name: Brandi Turner Unijb'd Date: 06/17/2024 Age:36 hours Reason for consult: Follow-up assessment;Maternal discharge;Early term 37-38.6wks  P5, 37 wks, @ 65 hrs of life. Mom awaiting stork pump delivery- called to expedite. Per mom - feels breasts filling with milk. Hand pump provided.  Encouraged mom to keep working on big mouth latch with baby and use EBM or coconut oil after each feed. Discussed cluster feeding overnight/ early morning brings in our milk supply, shared expectations of milk coming in. Highlighted risk of engorgement. Discussed hand pump/express to soften breasts, motrin  as anti-inflammatory, and ice packs for 10-20 minutes post feed/pumping if still over-full is the best treatments for inflamed/engorged breasts.  LC reviewed low birth weight feeding plan as followed: -Whole feeding may not exceed 30 minutes total. -Breastfeed first for no longer than 15 minutes. -Offer formula for supplementation after for no longer than 15 minutes. -Pump to stimulate breasts for no longer than 15 minutes.  Maternal Data Does the patient have breastfeeding experience prior to this delivery?: Yes How long did the patient breastfeed?: 1 yr to each of her other 4 children  Feeding Mother's Current Feeding Choice: Breast Milk and Formula  Lactation Tools Discussed/Used Tools: Flanges;Pump Breast pump type: Manual Pump Education: Setup, frequency, and cleaning;Milk Storage  Interventions Interventions: Breast feeding basics reviewed;Hand express;Breast compression;Hand pump;DEBP;Education;LC Services brochure;CDC milk storage guidelines  Discharge Discharge Education: Engorgement and breast care Pump: Referral sent for Stork Pump;Manual (Call to [employer]- talked to Delanda- Mom could be discharged today. Pump should be delivered today- everything is ready to be brought to hospital for delivery-)  Consult  Status Consult Status: Complete Date: 06/17/24 Follow-up type: In-patient    [facility] 06/17/2024, 10:46 AM

## 2024-06-18 ENCOUNTER — Other Ambulatory Visit

## 2024-06-18 ENCOUNTER — Ambulatory Visit

## 2024-06-20 ENCOUNTER — Ambulatory Visit

## 2024-06-23 ENCOUNTER — Inpatient Hospital Stay (HOSPITAL_COMMUNITY)

## 2024-06-24 ENCOUNTER — Inpatient Hospital Stay (HOSPITAL_COMMUNITY)

## 2024-06-24 ENCOUNTER — Telehealth (HOSPITAL_COMMUNITY): Payer: Self-pay | Admitting: *Deleted

## 2024-06-24 ENCOUNTER — Ambulatory Visit

## 2024-06-24 ENCOUNTER — Inpatient Hospital Stay (HOSPITAL_COMMUNITY): Admission: AD | Admit: 2024-06-24 | Source: Home / Self Care | Admitting: Family Medicine

## 2024-06-24 NOTE — Telephone Encounter (Signed)
 06/24/2024  Name: Brandi Turner MRN: [id number] DOB: 1985/10/27  Reason for Call:  Transition of Care Hospital Discharge Call  Contact Status: Patient Contact Status: Message  Language assistant needed: Interpreter Mode: Telephonic Interpreter Interpreter Name: Alejo [id number]        Follow-Up Questions:    Van Postnatal Depression Scale:  In the Past 7 Days:    PHQ2-9 Depression Scale:     Discharge Follow-up:    Post-discharge interventions: NA  Mliss Sieve, RN 06/24/2024 15:28

## 2024-07-14 ENCOUNTER — Encounter: Payer: Self-pay | Admitting: Physician Assistant

## 2024-07-14 ENCOUNTER — Ambulatory Visit (INDEPENDENT_AMBULATORY_CARE_PROVIDER_SITE_OTHER): Admitting: Physician Assistant

## 2024-07-14 DIAGNOSIS — O139 Gestational [pregnancy-induced] hypertension without significant proteinuria, unspecified trimester: Secondary | ICD-10-CM

## 2024-07-14 NOTE — Progress Notes (Signed)
 Post Partum Visit Note  Brandi Turner is a 38 y.o. H4E4994 female who presents for a postpartum visit. She is 4 weeks postpartum following a normal spontaneous vaginal delivery.  I have fully reviewed the prenatal and intrapartum course. The delivery was at 37 gestational weeks.  Anesthesia: none. Postpartum course has been very good. Brandi Turner is doing well yes. Baby is feeding by breast. Bleeding no bleeding. Bowel function is normal. Bladder function is normal. Patient is not sexually active. Contraception method is abstinence. Postpartum depression screening: negative.  The pregnancy intention screening data noted above was reviewed. Potential methods of contraception were discussed. The patient elected to proceed with No data recorded.   Edinburgh Postnatal Depression Scale - 07/14/24 1100       Edinburgh Postnatal Depression Scale:  In the Past 7 Days   I have been able to laugh and see the funny side of things. 0    I have looked forward with enjoyment to things. 0    I have blamed myself unnecessarily when things went wrong. 0    I have been anxious or worried for no good reason. 0    I have felt scared or panicky for no good reason. 0    Things have been getting on top of me. 0    I have been so unhappy that I have had difficulty sleeping. 0    I have felt sad or miserable. 0    I have been so unhappy that I have been crying. 0    The thought of harming myself has occurred to me. 0    Edinburgh Postnatal Depression Scale Total 0         Health Maintenance Due  Topic Date Due   Hepatitis B Vaccines 19-59 Average Risk (1 of 3 - 19+ 3-dose series) Never done   HPV VACCINES (1 - 3-dose SCDM series) Never done   Influenza Vaccine  Never done   COVID-19 Vaccine (3 - 2025-26 season) 06/23/2024    The following portions of the patient's history were reviewed and updated as appropriate: allergies, current medications, past family history, past medical history, past social history, past  surgical history, and problem list.  Review of Systems Pertinent items noted in HPI and remainder of comprehensive ROS otherwise negative.  Objective:  BP 110/79   Pulse 86   Ht 5' 1 (1.549 m)   Wt 114 lb 6.4 oz (51.9 kg)   LMP  (LMP Unknown) Comment: towards the end of December  Breastfeeding Yes   BMI 21.62 kg/m    General:  alert, cooperative, and appears stated age   Breasts:  normal  Lungs: clear to auscultation bilaterally  Heart:  regular rate and rhythm, S1, S2 normal, no murmur, click, rub or gallop  Abdomen: soft, non-tender; bowel sounds normal; no masses,  no organomegaly   Wound well approximated laceration without erythema, edema, TTP  GU exam:  normal       Assessment:   1. Postpartum exam (Primary) 2. Gestational hypertension, antepartum Patient doing well  Finished furosemide  and KCl tablets, normotensive today 1st degree laceration healing well  Politely declines contraception  Plan:   Essential components of care per ACOG recommendations:  1.  Mood and well being: Patient with negative depression screening today. Reviewed local resources for support.  - Patient tobacco use? No.   - hx of drug use? No.    2. Infant care and feeding:  -Patient currently breastmilk feeding? Yes. Reviewed  importance of draining breast regularly to support lactation.  -Social determinants of health (SDOH) reviewed in EPIC. No concerns identified.   3. Sexuality, contraception and birth spacing - Patient does not want a pregnancy in the next year.  Patient unsure about desired family size.  - Reviewed reproductive life planning. Reviewed contraceptive methods based on pt preferences and effectiveness.  Patient desired No Method - Other Reason today.   - Discussed birth spacing of 18 months  4. Sleep and fatigue -Encouraged family/partner/community support of 4 hrs of uninterrupted sleep to help with mood and fatigue  5. Physical Recovery  - Discussed patients  delivery and complications. She describes her labor as good. - Patient had a Vaginal, no problems at delivery. Patient had a 1st degree laceration. Perineal healing reviewed. Patient expressed understanding - Patient has urinary incontinence? No. - Patient is safe to resume physical and sexual activity  6.  Health Maintenance - HM due items addressed Yes - Last pap smear  Diagnosis  Date Value Ref Range Status  02/19/2024   Final   - Negative for intraepithelial lesion or malignancy (NILM)   Pap smear not indicated at today's visit.  -Breast Cancer screening indicated? No.   7. Chronic Disease/Pregnancy Condition follow up: None  - PCP follow up  Brandi FORBES Moats, PA-C Center for [employer], [facility]

## 2024-07-14 NOTE — Progress Notes (Signed)
 Pt denies getting dermoplast cream from hospital. Rx written with 3 refills.   Not wishing to get on birth control today.   Asking about safe meds to take while breastfeeding; safe med list provided to pt.
